# Patient Record
Sex: Female | Born: 1970
Health system: Southern US, Community
[De-identification: ages and names within clinical notes are randomized; demographics above are authoritative.]

## PROBLEM LIST (undated history)

## (undated) DIAGNOSIS — L719 Rosacea, unspecified: Secondary | ICD-10-CM

## (undated) DIAGNOSIS — O24419 Gestational diabetes mellitus in pregnancy, unspecified control: Secondary | ICD-10-CM

## (undated) DIAGNOSIS — L709 Acne, unspecified: Secondary | ICD-10-CM

## (undated) DIAGNOSIS — E786 Lipoprotein deficiency: Secondary | ICD-10-CM

## (undated) DIAGNOSIS — N3010 Interstitial cystitis (chronic) without hematuria: Secondary | ICD-10-CM

## (undated) DIAGNOSIS — B1910 Unspecified viral hepatitis B without hepatic coma: Secondary | ICD-10-CM

## (undated) DIAGNOSIS — I10 Essential (primary) hypertension: Secondary | ICD-10-CM

## (undated) HISTORY — DX: Rosacea, unspecified: L71.9

## (undated) HISTORY — DX: Acne, unspecified: L70.9

## (undated) HISTORY — PX: OTHER SURGICAL HISTORY: SHX169

## (undated) HISTORY — DX: Unspecified viral hepatitis B without hepatic coma: B19.10

## (undated) HISTORY — DX: Interstitial cystitis (chronic) without hematuria: N30.10

## (undated) HISTORY — DX: Gestational diabetes mellitus in pregnancy, unspecified control: O24.419

## (undated) HISTORY — DX: Lipoprotein deficiency: E78.6

## (undated) HISTORY — DX: Essential (primary) hypertension: I10

---

## 1997-11-23 ENCOUNTER — Inpatient Hospital Stay (HOSPITAL_COMMUNITY): Admission: AD | Admit: 1997-11-23 | Discharge: 1997-11-23 | Payer: Self-pay | Admitting: *Deleted

## 1997-12-19 ENCOUNTER — Encounter: Admission: RE | Admit: 1997-12-19 | Discharge: 1998-03-19 | Payer: Self-pay | Admitting: *Deleted

## 1997-12-26 ENCOUNTER — Inpatient Hospital Stay (HOSPITAL_COMMUNITY): Admission: AD | Admit: 1997-12-26 | Discharge: 1997-12-26 | Payer: Self-pay | Admitting: *Deleted

## 1998-01-17 ENCOUNTER — Inpatient Hospital Stay (HOSPITAL_COMMUNITY): Admission: AD | Admit: 1998-01-17 | Discharge: 1998-01-20 | Payer: Self-pay | Admitting: Obstetrics and Gynecology

## 1999-04-04 ENCOUNTER — Other Ambulatory Visit: Admission: RE | Admit: 1999-04-04 | Discharge: 1999-04-04 | Payer: Self-pay | Admitting: *Deleted

## 1999-05-19 ENCOUNTER — Emergency Department (HOSPITAL_COMMUNITY): Admission: EM | Admit: 1999-05-19 | Discharge: 1999-05-19 | Payer: Self-pay | Admitting: *Deleted

## 2000-04-26 ENCOUNTER — Other Ambulatory Visit: Admission: RE | Admit: 2000-04-26 | Discharge: 2000-04-26 | Payer: Self-pay | Admitting: *Deleted

## 2002-11-01 ENCOUNTER — Other Ambulatory Visit: Admission: RE | Admit: 2002-11-01 | Discharge: 2002-11-01 | Payer: Self-pay | Admitting: Obstetrics & Gynecology

## 2004-10-28 ENCOUNTER — Ambulatory Visit (HOSPITAL_BASED_OUTPATIENT_CLINIC_OR_DEPARTMENT_OTHER): Admission: RE | Admit: 2004-10-28 | Discharge: 2004-10-28 | Payer: Self-pay | Admitting: Urology

## 2004-10-28 ENCOUNTER — Ambulatory Visit (HOSPITAL_COMMUNITY): Admission: RE | Admit: 2004-10-28 | Discharge: 2004-10-28 | Payer: Self-pay | Admitting: Urology

## 2005-12-11 ENCOUNTER — Ambulatory Visit (HOSPITAL_BASED_OUTPATIENT_CLINIC_OR_DEPARTMENT_OTHER): Admission: RE | Admit: 2005-12-11 | Discharge: 2005-12-11 | Payer: Self-pay | Admitting: Urology

## 2006-05-28 ENCOUNTER — Ambulatory Visit (HOSPITAL_COMMUNITY): Admission: RE | Admit: 2006-05-28 | Discharge: 2006-05-28 | Payer: Self-pay

## 2007-05-13 ENCOUNTER — Ambulatory Visit: Payer: Self-pay | Admitting: Internal Medicine

## 2007-05-16 ENCOUNTER — Emergency Department (HOSPITAL_COMMUNITY): Admission: EM | Admit: 2007-05-16 | Discharge: 2007-05-16 | Payer: Self-pay | Admitting: Emergency Medicine

## 2007-05-16 ENCOUNTER — Telehealth: Payer: Self-pay | Admitting: *Deleted

## 2007-05-19 ENCOUNTER — Emergency Department (HOSPITAL_COMMUNITY): Admission: EM | Admit: 2007-05-19 | Discharge: 2007-05-19 | Payer: Self-pay | Admitting: Family Medicine

## 2007-05-20 ENCOUNTER — Ambulatory Visit: Payer: Self-pay | Admitting: Internal Medicine

## 2007-05-20 DIAGNOSIS — N61 Mastitis without abscess: Secondary | ICD-10-CM

## 2007-06-20 ENCOUNTER — Ambulatory Visit: Payer: Self-pay | Admitting: Internal Medicine

## 2007-07-19 ENCOUNTER — Ambulatory Visit: Payer: Self-pay | Admitting: Internal Medicine

## 2007-07-19 DIAGNOSIS — N3010 Interstitial cystitis (chronic) without hematuria: Secondary | ICD-10-CM

## 2007-07-19 DIAGNOSIS — N644 Mastodynia: Secondary | ICD-10-CM | POA: Insufficient documentation

## 2007-07-24 DIAGNOSIS — R946 Abnormal results of thyroid function studies: Secondary | ICD-10-CM | POA: Insufficient documentation

## 2007-07-27 ENCOUNTER — Telehealth: Payer: Self-pay | Admitting: *Deleted

## 2007-07-27 LAB — CONVERTED CEMR LAB
ALT: 30 units/L (ref 0–35)
AST: 26 units/L (ref 0–37)
Albumin: 3.9 g/dL (ref 3.5–5.2)
Alkaline Phosphatase: 36 units/L — ABNORMAL LOW (ref 39–117)
BUN: 10 mg/dL (ref 6–23)
Basophils Absolute: 0 10*3/uL (ref 0.0–0.1)
Basophils Relative: 0.8 % (ref 0.0–1.0)
Bilirubin, Direct: 0.1 mg/dL (ref 0.0–0.3)
CO2: 28 meq/L (ref 19–32)
Calcium: 9.5 mg/dL (ref 8.4–10.5)
Chloride: 106 meq/L (ref 96–112)
Cholesterol: 162 mg/dL (ref 0–200)
Creatinine, Ser: 0.7 mg/dL (ref 0.4–1.2)
Eosinophils Absolute: 0.1 10*3/uL (ref 0.0–0.7)
Eosinophils Relative: 1.6 % (ref 0.0–5.0)
GFR calc Af Amer: 121 mL/min
GFR calc non Af Amer: 100 mL/min
Glucose, Bld: 97 mg/dL (ref 70–99)
HCT: 37.7 % (ref 36.0–46.0)
HDL: 45 mg/dL (ref >39.0)
Hemoglobin: 12.9 g/dL (ref 12.0–15.0)
LDL Cholesterol: 103 mg/dL — ABNORMAL HIGH (ref 0–99)
Lymphocytes Relative: 38.1 % (ref 12.0–46.0)
MCHC: 34.3 g/dL (ref 30.0–36.0)
MCV: 90.3 fL (ref 78.0–100.0)
Monocytes Absolute: 0.5 10*3/uL (ref 0.1–1.0)
Monocytes Relative: 10.6 % (ref 3.0–12.0)
Neutro Abs: 2.6 10*3/uL (ref 1.4–7.7)
Neutrophils Relative %: 48.9 % (ref 43.0–77.0)
Platelets: 231 10*3/uL (ref 150–400)
Potassium: 4.1 meq/L (ref 3.5–5.1)
RBC: 4.17 M/uL (ref 3.87–5.11)
RDW: 13.2 % (ref 11.5–14.6)
Sodium: 142 meq/L (ref 135–145)
TSH: 0.27 microintl units/mL — ABNORMAL LOW (ref 0.35–5.50)
Total Bilirubin: 0.8 mg/dL (ref 0.3–1.2)
Total CHOL/HDL Ratio: 3.6
Total Protein: 7.3 g/dL (ref 6.0–8.3)
Triglycerides: 69 mg/dL (ref 0–149)
VLDL: 14 mg/dL (ref 0–40)
WBC: 5.1 10*3/uL (ref 4.5–10.5)

## 2007-07-29 ENCOUNTER — Ambulatory Visit: Payer: Self-pay | Admitting: Internal Medicine

## 2007-08-02 ENCOUNTER — Ambulatory Visit: Payer: Self-pay | Admitting: Internal Medicine

## 2007-08-15 ENCOUNTER — Encounter: Payer: Self-pay | Admitting: Internal Medicine

## 2007-08-19 ENCOUNTER — Encounter: Admission: RE | Admit: 2007-08-19 | Discharge: 2007-08-19 | Payer: Self-pay | Admitting: Surgery

## 2007-10-05 ENCOUNTER — Other Ambulatory Visit: Admission: RE | Admit: 2007-10-05 | Discharge: 2007-10-05 | Payer: Self-pay | Admitting: Internal Medicine

## 2007-10-05 ENCOUNTER — Encounter: Payer: Self-pay | Admitting: Internal Medicine

## 2007-10-05 ENCOUNTER — Ambulatory Visit: Payer: Self-pay | Admitting: Internal Medicine

## 2007-10-05 DIAGNOSIS — R300 Dysuria: Secondary | ICD-10-CM

## 2007-10-05 LAB — CONVERTED CEMR LAB
Bilirubin Urine: NEGATIVE
Blood in Urine, dipstick: NEGATIVE
Glucose, Urine, Semiquant: NEGATIVE
Ketones, urine, test strip: NEGATIVE
Nitrite: NEGATIVE
Protein, U semiquant: NEGATIVE
Specific Gravity, Urine: 1.005
Urobilinogen, UA: 0.2
pH: 8

## 2007-10-06 ENCOUNTER — Encounter: Payer: Self-pay | Admitting: Internal Medicine

## 2007-10-24 ENCOUNTER — Encounter: Payer: Self-pay | Admitting: Internal Medicine

## 2008-01-18 ENCOUNTER — Ambulatory Visit: Payer: Self-pay | Admitting: Internal Medicine

## 2008-01-18 DIAGNOSIS — T50995A Adverse effect of other drugs, medicaments and biological substances, initial encounter: Secondary | ICD-10-CM

## 2008-01-18 LAB — CONVERTED CEMR LAB
Bilirubin Urine: NEGATIVE
Glucose, Urine, Semiquant: NEGATIVE
Ketones, urine, test strip: NEGATIVE
Urobilinogen, UA: 1
pH: 6

## 2008-01-19 ENCOUNTER — Encounter: Payer: Self-pay | Admitting: Internal Medicine

## 2008-03-02 ENCOUNTER — Ambulatory Visit: Payer: Self-pay | Admitting: Family Medicine

## 2008-03-02 DIAGNOSIS — J019 Acute sinusitis, unspecified: Secondary | ICD-10-CM

## 2009-01-05 HISTORY — PX: ESSURE TUBAL LIGATION: SUR464

## 2009-01-24 ENCOUNTER — Ambulatory Visit: Payer: Self-pay | Admitting: Family Medicine

## 2009-01-24 DIAGNOSIS — M549 Dorsalgia, unspecified: Secondary | ICD-10-CM | POA: Insufficient documentation

## 2009-05-09 ENCOUNTER — Ambulatory Visit: Payer: Self-pay | Admitting: Internal Medicine

## 2009-05-09 DIAGNOSIS — L708 Other acne: Secondary | ICD-10-CM

## 2009-05-09 DIAGNOSIS — N390 Urinary tract infection, site not specified: Secondary | ICD-10-CM | POA: Insufficient documentation

## 2009-05-09 LAB — CONVERTED CEMR LAB
Bilirubin Urine: NEGATIVE
Glucose, Urine, Semiquant: NEGATIVE
Ketones, urine, test strip: NEGATIVE
Protein, U semiquant: NEGATIVE
Specific Gravity, Urine: 1.02
pH: 5.5

## 2009-07-19 ENCOUNTER — Ambulatory Visit: Payer: Self-pay | Admitting: Internal Medicine

## 2009-07-19 LAB — CONVERTED CEMR LAB
Albumin: 3.8 g/dL (ref 3.5–5.2)
Basophils Absolute: 0 10*3/uL (ref 0.0–0.1)
Basophils Relative: 0.6 % (ref 0.0–3.0)
CO2: 29 meq/L (ref 19–32)
Chloride: 111 meq/L (ref 96–112)
Creatinine, Ser: 0.6 mg/dL (ref 0.4–1.2)
Eosinophils Absolute: 0.1 10*3/uL (ref 0.0–0.7)
Glucose, Bld: 112 mg/dL — ABNORMAL HIGH (ref 70–99)
Glucose, Urine, Semiquant: NEGATIVE
HDL: 35 mg/dL — ABNORMAL LOW (ref >39.00)
Hemoglobin: 12.6 g/dL (ref 12.0–15.0)
Ketones, urine, test strip: NEGATIVE
Lymphs Abs: 2.7 10*3/uL (ref 0.7–4.0)
MCHC: 34.2 g/dL (ref 30.0–36.0)
MCV: 92.2 fL (ref 78.0–100.0)
Monocytes Absolute: 0.5 10*3/uL (ref 0.1–1.0)
Neutro Abs: 4.1 10*3/uL (ref 1.4–7.7)
Nitrite: NEGATIVE
RBC: 4.01 M/uL (ref 3.87–5.11)
RDW: 13.7 % (ref 11.5–14.6)
Sodium: 140 meq/L (ref 135–145)
Specific Gravity, Urine: >=1.03
TSH: 0.41 microintl units/mL (ref 0.35–5.50)
Total CHOL/HDL Ratio: 3
Total Protein: 6.8 g/dL (ref 6.0–8.3)
Triglycerides: 98 mg/dL (ref 0.0–149.0)
pH: 6

## 2009-07-24 ENCOUNTER — Ambulatory Visit: Payer: Self-pay | Admitting: Internal Medicine

## 2009-07-24 ENCOUNTER — Other Ambulatory Visit: Admission: RE | Admit: 2009-07-24 | Discharge: 2009-07-24 | Payer: Self-pay | Admitting: Internal Medicine

## 2009-07-24 DIAGNOSIS — E786 Lipoprotein deficiency: Secondary | ICD-10-CM | POA: Insufficient documentation

## 2009-07-24 DIAGNOSIS — R7309 Other abnormal glucose: Secondary | ICD-10-CM

## 2009-07-29 LAB — CONVERTED CEMR LAB: Pap Smear: NEGATIVE

## 2009-10-25 ENCOUNTER — Ambulatory Visit: Payer: Self-pay | Admitting: Internal Medicine

## 2009-10-25 LAB — CONVERTED CEMR LAB
Glucose, Urine, Semiquant: NEGATIVE
Protein, U semiquant: NEGATIVE
Specific Gravity, Urine: 1.025
pH: 6

## 2009-10-29 ENCOUNTER — Telehealth: Payer: Self-pay | Admitting: Internal Medicine

## 2009-11-15 ENCOUNTER — Ambulatory Visit: Payer: Self-pay | Admitting: Internal Medicine

## 2009-11-16 ENCOUNTER — Encounter: Payer: Self-pay | Admitting: Internal Medicine

## 2009-12-06 ENCOUNTER — Ambulatory Visit: Payer: Self-pay | Admitting: Internal Medicine

## 2009-12-06 DIAGNOSIS — J042 Acute laryngotracheitis: Secondary | ICD-10-CM | POA: Insufficient documentation

## 2010-01-28 ENCOUNTER — Ambulatory Visit (HOSPITAL_COMMUNITY)
Admission: RE | Admit: 2010-01-28 | Discharge: 2010-01-28 | Payer: Self-pay | Source: Home / Self Care | Attending: Obstetrics and Gynecology | Admitting: Obstetrics and Gynecology

## 2010-01-29 LAB — PREGNANCY, URINE: Preg Test, Ur: NEGATIVE

## 2010-02-02 LAB — CONVERTED CEMR LAB: TSH: 0.5 microintl units/mL (ref 0.35–5.50)
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
## 2010-02-06 NOTE — Assessment & Plan Note (Signed)
Summary: severe cough//nn   Vital Signs:  Patient profile:   40 year old female Menstrual status:  regular Weight:      134 pounds O2 Sat:      98 % on Room air Temp:     98.3 degrees F oral BP sitting:   102 / 78  (left arm) Cuff size:   regular  Vitals Entered By: Kern Reap CMA Duncan Dull) (December 06, 2009 2:10 PM)  O2 Flow:  Room air CC: cough and congestion   History of Present Illness: Janet Nichols  comes in today  for SDA  for above  to be seen before weekend as  .   she was seen at urgent care [facility]     was given cheritussin and still coughing .     Hurts when coughs   . no fever and some cold chills .  NOt seeming to get better .    Out  of cough med.   Stuffy nose   a bit . Denies face pain   onset around Nov 24   and continue.  child may be gettig sick also  Preventive Screening-Counseling & Management  Alcohol-Tobacco     Alcohol drinks/day: 0     Smoking Status: never  Allergies: 1)  ! Septra  Past History:  Past medical, surgical, family and social histories (including risk factors) reviewed, and no changes noted (except as noted below).  Past Medical History: Reviewed history from 07/24/2009 and no changes required. Unremarkable Dr Logan Bores  for bladder  issues    ? IC childbirth x 2     [name] is [name]  urologist    Dr. Orville Govern  gestational diabetes x 1  low hdl acne /rosacea  Past Surgical History: Reviewed history from 10/25/2009 and no changes required. Bladder stretched years ago E-sure procedure  Family History: Reviewed history from 07/24/2009 and no changes required. cousin  breast cancer 48 yrs   MOM DM HBP Sis has HBP        Social History: Reviewed history from 07/24/2009 and no changes required. Married Never Smoked   HH of  4    no pets sleep 7 hours    40 hours at least    exercise  sometimes      Review of Systems       The patient complains of anorexia, hoarseness, and chest pain.  The patient denies  fever, decreased hearing, dyspnea on exertion, peripheral edema, prolonged cough, hemoptysis, abdominal pain, melena, hematochezia, difficulty walking, abnormal bleeding, enlarged lymph nodes, and angioedema.    Physical Exam  General:  non toxic  hoarse in nad wiwth dry cough   Head:  Normocephalic and atraumatic without obvious abnormalities. No apparent alopecia or balding. Eyes:  clear  Ears:  R ear normal.  wax in left  Nose:  no external deformity and no external erythema.  clear congestion and face nontender Mouth:  pharynx pink and moist.   Neck:  tender  shoddy ac nodes  Lungs:  Normal respiratory effort, chest expands symmetrically. Lungs are clear to auscultation, no crackles or wheezes.no dullness.   Heart:  Normal rate and regular rhythm. S1 and S2 normal without gallop, murmur, click, rub or other extra sounds.no lifts.   Pulses:  nl cap refill  Extremities:  no clubbing cyanosis or edema  Skin:  turgor normal, color normal, no ecchymoses, and no petechiae.   Cervical Nodes:  No lymphadenopathy noted see neck exam  Psych:  Oriented X3, good eye contact, not anxious appearing, and not depressed appearing.     Impression & Recommendations:  Problem # 1:  ACUTE LARYNGOTRACHEITIS W/O MENTION OBSTRUCTION (ICD-464.20) still seems viral    and no alarm features  .   Expectant management and continue supportive care .       elevated risk or pneumonia  signs   Complete Medication List: 1)  Doxycycline Hyclate 100 Mg Caps (Doxycycline hyclate) .Marland Kitchen.. 1 by mouth a day for acne 2)  Adapalene 0.1 % Crea (Adapalene) .... Apply to face  q hs  pea siced amount 3)  Hydrocodone-homatropine 5-1.5 Mg/86ml Syrp (Hydrocodone-homatropine) .Marland Kitchen.. 1-2 tsp by mouth q4-6 hours as needed cough  Patient Instructions: 1)  I think this is a viral  respiratory infection  like croup in a child. 2)  no signs of pneumonia today. You pulse ox is 98-99% 3)  cough med for comfort  4)  try adding plain Mucinex   to loosen and make the cough easier.  5)  expect improvement in the next 5-7 days   6)  call if fever  shortness of breath or wheezing .  Prescriptions: HYDROCODONE-HOMATROPINE 5-1.5 MG/5ML SYRP (HYDROCODONE-HOMATROPINE) 1-2 tsp by mouth q4-6 hours as needed cough  #6 oz x 1   Entered and Authorized by:   Madelin Headings MD   Signed by:   Madelin Headings MD on 12/06/2009   Method used:   Print then Give to Patient   RxID:   [id number]    Orders Added: 1)  Est. Patient Level III [14782]

## 2010-02-06 NOTE — Progress Notes (Signed)
----   Converted from flag ---- ---- 10/28/2009 3:06 PM, Wynona Canes, CMA wrote: Sorry, culture was not ordered.  ---- 10/26/2009 11:36 AM, Madelin Headings MD wrote: i asked for a culture but dont see that it was ordered .   ?  was  it sent? ------------------------------  Phone Note Outgoing Call      please call  patient  I was informed that the the urine  culture was not sent . Asssess  how she is doing  . if still has any .signs then do a culture  .  wkp.  LMTOCB Romualdo Bolk, CMA (AAMA)  October 30, 2009 12:07 PM  Spoke to pt and she states that she is having on and off burning. I told pt that she needs to come in to get a urine culture done. Pt states that she wants to wait until Thurs to get it done. She states that she can wait until then. Romualdo Bolk, CMA Duncan Dull)  November 01, 2009 1:26 PM noted Madelin Headings MD  November 01, 2009 1:56 PM

## 2010-02-06 NOTE — Assessment & Plan Note (Signed)
Summary: cpx--pap//ccm   Vital Signs:  Patient profile:   40 year old female Menstrual status:  regular LMP:     07/07/2009 Height:      61.5 inches Weight:      138 pounds Pulse rate:   78 / minute BP sitting:   120 / 80  (left arm) Cuff size:   regular  Vitals Entered By: Romualdo Bolk, CMA (AAMA) (July 24, 2009 2:19 PM) CC: CPX with pap LMP (date): 07/07/2009     Enter LMP: 07/07/2009 Last PAP Result NEGATIVE FOR INTRAEPITHELIAL LESIONS OR MALIGNANCY.   History of Present Illness: Janet Nichols comes in today  for preventive visit  follow up of  acne skin issues . Since last visit  here  there have been no major changes in health status   Her skin  has improved and no se of  meds noted.  Sees   urologist about once a year for her IC  no current problems.   Preventive Care Screening  Mammogram:    Date:  07/29/2007    Results:  normal   Prior Values:    Pap Smear:  NEGATIVE FOR INTRAEPITHELIAL LESIONS OR MALIGNANCY. (10/05/2007)    Last Tetanus Booster:  Tdap (07/19/2007)   Preventive Screening-Counseling & Management  Alcohol-Tobacco     Alcohol drinks/day: 0     Smoking Status: never  Caffeine-Diet-Exercise     Caffeine use/day: 0     Does Patient Exercise: yes     Type of exercise: treadmill   Current Medications (verified): 1)  Doxycycline Hyclate 100 Mg Caps (Doxycycline Hyclate) .Marland Kitchen.. 1 By Mouth Two Times A Day For Acne 2)  Adapalene 0.1 % Crea (Adapalene) .... Apply To Face  Q Hs  Pea Siced Amount  Allergies (verified): 1)  ! Septra  Past History:  Past medical, surgical, family and social histories (including risk factors) reviewed, and no changes noted (except as noted below).  Past Medical History: Unremarkable Dr Logan Bores  for bladder  issues    ? IC childbirth x 2     Janet Nichols is Janet Nichols  urologist    Dr. Orville Govern  gestational diabetes x 1  low hdl acne /rosacea  Past Surgical History: Reviewed history from 07/19/2007  and no changes required. Bladder stretched years ago  Past History:  Care Management: Urology: [facility]  Family History: Reviewed history from 01/18/2008 and no changes required. cousin  breast cancer 48 yrs   MOM DM HBP Sis has HBP        Social History: Reviewed history from 01/18/2008 and no changes required. Married Never Smoked   HH of  4    no pets sleep 7 hours    40 hours at least    exercise  sometimes      Review of Systems  The patient denies anorexia, fever, weight loss, weight gain, vision loss, decreased hearing, hoarseness, chest pain, syncope, dyspnea on exertion, peripheral edema, prolonged cough, headaches, hemoptysis, abdominal pain, melena, hematochezia, severe indigestion/heartburn, hematuria, incontinence, genital sores, muscle weakness, suspicious skin lesions, transient blindness, difficulty walking, depression, unusual weight change, abnormal bleeding, enlarged lymph nodes, angioedema, and breast masses.    Physical Exam  General:  Well-developed,well-nourished,in no acute distress; alert,appropriate and cooperative throughout examination Head:  normocephalic and atraumatic.   Eyes:  PERRL, EOMs full, conjunctiva clear  Ears:  R ear normal, L ear normal, and no external deformities.   Nose:  no external deformity, no external erythema, and no nasal  discharge.   Mouth:  pharynx pink and moist.   Neck:  No deformities, masses, or tenderness noted. / thyroid palpable Chest Wall:  No deformities, masses, or tenderness noted. Breasts:  No mass, nodules, thickening, tenderness, bulging, retraction, inflamation, nipple discharge or skin changes noted.   Lungs:  Normal respiratory effort, chest expands symmetrically. Lungs are clear to auscultation, no crackles or wheezes. Heart:  Normal rate and regular rhythm. S1 and S2 normal without gallop, murmur, click, rub or other extra sounds. Abdomen:  Bowel sounds positive,abdomen soft and non-tender  without masses, organomegaly or hernias noted. Rectal:  No external abnormalities noted. Normal sphincter tone. No rectal masses or tenderness. Genitalia:  Pelvic Exam:        External: normal female genitalia without lesions or masses        Vagina: normal without lesions or masses white discharge         Cervix: normal without lesions or masses flat extopy sliglty friable   white dc         Adnexa: normal bimanual exam without masses or fullness        Uterus: normal by palpation        Pap smear: performed Msk:  no joint swelling, no joint warmth, no redness over joints, and no joint deformities.   Pulses:  R and L carotid,radial,femoral,dorsalis pedis and posterior tibial pulses are full and equal bilaterally Extremities:  no clubbing cyanosis or edema  Neurologic:  alert & oriented X3 and cranial nerves IIi-XII intact.   strength normal in all extremities, and gait normal.   Skin:  turgor normal and color normal.   face with some erythema ocass pustule chin and few papules mid face   Cervical Nodes:  No lymphadenopathy noted Psych:  Oriented X3, normally interactive, good eye contact, not anxious appearing, and not depressed appearing.     Impression & Recommendations:  Problem # 1:  Preventive Health Care (ICD-V70.0) Discussed nutrition,exercise,diet,healthy weight, vitamin D and calcium.   Problem # 2:  ROUTINE GYNECOLOGICAL EXAM (ICD-V72.31)  pap done   rx for yeast if gets signs   Orders: Pap Smear, Thin Prep ( Collection of) ([id number])  Problem # 3:  ACNE VULGARIS, FACIAL (ICD-706.1) poss   rosacea ?   Her updated medication list for this problem includes:    Doxycycline Hyclate 100 Mg Caps (Doxycycline hyclate) .Marland Kitchen... 1 by mouth two times a day for acne    Adapalene 0.1 % Crea (Adapalene) .Marland Kitchen... Apply to face  q hs  pea siced amount  Problem # 4:  HYPERGLYCEMIA (ICD-790.29) with fam hs and personal hx of gestational dm high risk        counseled   Problem # 5:  LOW HDL  (ICD-272.5) Assessment: Comment Only see above   Problem # 6:  INTERSTITIAL CYSTITIS (ICD-595.1) Assessment: Comment Only  Complete Medication List: 1)  Doxycycline Hyclate 100 Mg Caps (Doxycycline hyclate) .Marland Kitchen.. 1 by mouth two times a day for acne 2)  Adapalene 0.1 % Crea (Adapalene) .... Apply to face  q hs  pea siced amount  Patient Instructions: 1)  after a month  decrease   the  doxyxycline to once a day  2)  ROV in 3 months or as needed. 3)  Consider Add hormonal therapy   if needed then . 4)  Your good lipids are low and her blood sugar is  somehwat elevated .  You are at risk of developing diabetes so avoid sweets and simple  sugars.      5)  Increase exercise.

## 2010-02-06 NOTE — Assessment & Plan Note (Signed)
Summary: ?uti//ccm   Vital Signs:  Patient profile:   40 year old female Menstrual status:  regular LMP:     04/25/2009 Height:      62 inches Weight:      134 pounds BMI:     24.60 Temp:     98.2 degrees F oral Pulse rate:   78 / minute BP sitting:   100 / 60  (left arm) Cuff size:   regular  Vitals Entered By: Romualdo Bolk, CMA (AAMA) (May 09, 2009 10:16 AM) CC: Urinary frequency, burning upon urination, cloudy urine. This started on 05/07/09, lower back pain LMP (date): 04/25/2009     Menstrual Status regular Enter LMP: 04/25/2009 Last PAP Result NEGATIVE FOR INTRAEPITHELIAL LESIONS OR MALIGNANCY.   History of Present Illness: Janet Nichols comes in   for  above .  She has had 2 days of acut onset burning and frequency and lbp without fever . this feels like utii she has had over a year ago.,  Urine is  cloudy   .  ALso face  keeps breaking out    worse recent months  with periods htat are otherwise nl.  Doesn remember this bad when younger.  No rx now .   saw derm years ago and doesnt remember what  she was rx with.    Periods are normal  by report.    Preventive Screening-Counseling & Management  Alcohol-Tobacco     Alcohol drinks/day: 0     Smoking Status: never  Caffeine-Diet-Exercise     Caffeine use/day: 0     Does Patient Exercise: yes  Current Medications (verified): 1)  None  Allergies (verified): 1)  ! Septra  Past History:  Past medical, surgical, family and social histories (including risk factors) reviewed, and no changes noted (except as noted below).  Past Medical History: Reviewed history from 01/18/2008 and no changes required. Unremarkable Dr Logan Bores  for bladder  issues    ? IC childbirth x 2     Janet Nichols is Janet Nichols  urologist    Dr. Orville Govern   Past Surgical History: Reviewed history from 07/19/2007 and no changes required. Bladder stretched years ago  Past History:  Care Management: Urology: [facility]  Family History: Reviewed history from 01/18/2008 and no changes required. cousin  breast cancer 48 yrs   MOM DM HBP Sis has HBP       Social History: Reviewed history from 01/18/2008 and no changes required. Married Never Smoked   HH of  5  no pets sleep 7 hours    40 hours at least   exercise  sometimes    Caffeine use/day:  0 Does Patient Exercise:  yes  Review of Systems  The patient denies anorexia, fever, weight loss, weight gain, abdominal pain, melena, hematochezia, severe indigestion/heartburn, hematuria, incontinence, genital sores, abnormal bleeding, and enlarged lymph nodes.         periods regular  Physical Exam  General:  Well-developed,well-nourished,in no acute distress; alert,appropriate and cooperative throughout examination Head:  Normocephalic and atraumatic without obvious abnormalities. No apparent alopecia or balding. Eyes:  clear  Neck:  No deformities, masses, or tenderness noted. Lungs:  normal respiratory effort, no intercostal retractions, and no accessory muscle use.   Heart:  Normal rate and regular rhythm. S1 and S2 normal without gallop, murmur, click, rub or other extra sounds. Abdomen:  Bowel sounds positive,abdomen soft and non-tender without masses, organomegaly or hernias noted. no flank pain   poiints  to low back as area of concern Pulses:  nl cap refill  Skin:  red and cystic areas miud face left more than right with pustules also  some erythema and find papules   No balding or hair loss  Cervical Nodes:  No lymphadenopathy noted Psych:  Oriented X3, good eye contact, not anxious appearing, and not depressed appearing.     Impression & Recommendations:  Problem # 1:  UTI (ICD-599.0) Assessment New probably  Her updated medication list for this problem includes:    Nitrofurantoin Monohyd Macro 100 Mg Caps (Nitrofurantoin monohyd macro) .Marland Kitchen... 1 by mouth two times a day for  uti    Doxycycline Hyclate 100 Mg Caps (Doxycycline  hyclate) .Marland Kitchen... 1 by mouth two times a day for acne  Problem # 2:  ACNE VULGARIS, FACIAL (ICD-706.1) Assessment: New pretty sever and some cystic changes    some rosacea possible.     begin meds   may benefit from  ocps and referral if needed   will begin therapy and then follow up at [facility]  if not responding then other intervention  .  Her updated medication list for this problem includes:    Doxycycline Hyclate 100 Mg Caps (Doxycycline hyclate) .Marland Kitchen... 1 by mouth two times a day for acne    Adapalene 0.1 % Crea (Adapalene) .Marland Kitchen... Apply to face  q hs  pea siced amount  Problem # 3:  INTERSTITIAL CYSTITIS (ICD-595.1) Assessment: Comment Only  Complete Medication List: 1)  Nitrofurantoin Monohyd Macro 100 Mg Caps (Nitrofurantoin monohyd macro) .Marland Kitchen.. 1 by mouth two times a day for  uti 2)  Doxycycline Hyclate 100 Mg Caps (Doxycycline hyclate) .Marland Kitchen.. 1 by mouth two times a day for acne 3)  Adapalene 0.1 % Crea (Adapalene) .... Apply to face  q hs  pea siced amount  Other Orders: UA Dipstick w/o Micro (automated)  (81003)  Patient Instructions: 1)  take antibiotic for your UTI. 2)  After that begin  doxyxycline for the acne . 3)  begin the differin at night  small amount and wash off in am.  This can make your face a bit iriitated but  can  use every other night then  4)  should get better in 3-4 weeks . 5)  Keep July appt  and call in  meantime if have problems.  6)  Consider  adding hormonal therapy if not getting better ( BCPs)  Prescriptions: ADAPALENE 0.1 % CREA (ADAPALENE) apply to face  q hs  pea siced amount  #1 tube x 1   Entered and Authorized by:   Madelin Headings MD   Signed by:   Madelin Headings MD on 05/09/2009   Method used:   Electronically to        [facility]  [phone]* (retail)       [address]       [place], [place]  [address]       Ph: [phone] or [phone]       Fax: [phone]   RxID:   [id number] DOXYCYCLINE  HYCLATE 100 MG CAPS (DOXYCYCLINE HYCLATE) 1 by mouth two times a day for acne  #60 x 1   Entered and Authorized by:   Madelin Headings MD   Signed by:   Madelin Headings MD on 05/09/2009   Method used:   Electronically to        [facility]  #  [address] (retail)       [address]       [place], [place]  [address]       Ph: [phone] or [phone]       Fax: [phone]   RxID:   [id number] NITROFURANTOIN MONOHYD MACRO 100 MG CAPS (NITROFURANTOIN MONOHYD MACRO) 1 by mouth two times a day for  UTI  #14 x 0   Entered and Authorized by:   Madelin Headings MD   Signed by:   Madelin Headings MD on 05/09/2009   Method used:   Electronically to        [facility]  [phone]* (retail)       [address]       [place], [place]  [address]       Ph: [phone] or [phone]       Fax: [phone]   RxID:   [id number]   Laboratory Results   Urine Tests  Date/Time Received: May 09, 2009 10:31 AM  Routine Urinalysis   Color: yellow Appearance: Cloudy Glucose: negative   (Normal Range: Negative) Bilirubin: negative   (Normal Range: Negative) Ketone: negative   (Normal Range: Negative) Spec. Gravity: 1.020   (Normal Range: 1.003-1.035) Blood: small   (Normal Range: Negative) pH: 5.5   (Normal Range: 5.0-8.0) Protein: negative   (Normal Range: Negative) Urobilinogen: 0.2   (Normal Range: 0-1) Nitrite: negative   (Normal Range: Negative) Leukocyte Esterace: small   (Normal Range: Negative)

## 2010-02-06 NOTE — Progress Notes (Signed)
----   Converted from flag ---- ---- 10/28/2009 3:06 PM, Wynona Canes, CMA wrote: Sorry, culture was not ordered.  ---- 10/26/2009 11:36 AM, Madelin Headings MD wrote: i asked for a culture but dont see that it was ordered .   ?  was  it sent? ------------------------------

## 2010-02-06 NOTE — Assessment & Plan Note (Signed)
Summary: 3 month rov/njr   Vital Signs:  Patient profile:   40 year old female Menstrual status:  regular LMP:     10/07/2009 Weight:      137 pounds Pulse rate:   60 / minute BP sitting:   110 / 60  (right arm) Cuff size:   regular  Vitals Entered By: Romualdo Bolk, CMA (AAMA) (October 25, 2009 1:39 PM) CC: Follow-up visit and pt is having cloudy urine with burning upon urination. LMP (date): 10/07/2009     Enter LMP: 10/07/2009 Last PAP Result NEGATIVE FOR INTRAEPITHELIAL LESIONS OR MALIGNANCY.   History of Present Illness: Janet Nichols comes in today  for  follow up of her skin problem. she has been taking doxycycline one today enter topical. She feels improved however she stops the doxycycline it flares up again. Just had gyneuri proedure and went off med for a short time and flared up. So back on med . no side effect of medicine.  Also has  slight dysuria no frequency. for the last few days no fever back pain but wants to make sure she doesn't have a bladder infection.  Preventive Screening-Counseling & Management  Alcohol-Tobacco     Alcohol drinks/day: 0     Smoking Status: never  Caffeine-Diet-Exercise     Caffeine use/day: 0     Does Patient Exercise: yes     Type of exercise: treadmill   Current Medications (verified): 1)  Doxycycline Hyclate 100 Mg Caps (Doxycycline Hyclate) .Marland Kitchen.. 1 By Mouth Two Times A Day For Acne 2)  Adapalene 0.1 % Crea (Adapalene) .... Apply To Face  Q Hs  Pea Siced Amount  Allergies (verified): 1)  ! Septra  Past History:  Past medical, surgical, family and social histories (including risk factors) reviewed, and no changes noted (except as noted below).  Past Medical History: Reviewed history from 07/24/2009 and no changes required. Unremarkable Dr Logan Bores  for bladder  issues    ? IC childbirth x 2     [name] is [name]  urologist    Dr. Orville Govern  gestational diabetes x 1  low hdl acne /rosacea  Past Surgical  History: Bladder stretched years ago E-sure procedure  Family History: Reviewed history from 07/24/2009 and no changes required. cousin  breast cancer 48 yrs   MOM DM HBP Sis has HBP        Social History: Reviewed history from 07/24/2009 and no changes required. Married Never Smoked   HH of  4    no pets sleep 7 hours    40 hours at least    exercise  sometimes      Review of Systems  The patient denies anorexia, fever, weight loss, weight gain, vision loss, decreased hearing, abdominal pain, melena, hematochezia, hematuria, incontinence, muscle weakness, abnormal bleeding, enlarged lymph nodes, and angioedema.    Physical Exam  General:  Well-developed,well-nourished,in no acute distress; alert,appropriate and cooperative throughout examination Head:  normocephalic and atraumatic.   Eyes:  vision grossly intact.   Neck:  No deformities, masses, or tenderness noted. Lungs:  normal respiratory effort and no intercostal retractions.   Abdomen:  Bowel sounds positive,abdomen soft and non-tender without masses, organomegaly or  s noted.  no flank pain Neurologic:  non focal  Skin:  face  with mask erythema and a few paulaes   temple area with some  acne lesions.   Cervical Nodes:  No lymphadenopathy noted Psych:  Normal eye contact, appropriate affect. Cognition appears normal.  Impression & Recommendations:  Problem # 1:  ACNE VULGARIS, FACIAL (ICD-706.1) Assessment Improved some of this looks like rosacea also doxycycline would be optimal. It is okay to change to once a day at present consider other top vocals if needed. Her updated medication list for this problem includes:    Doxycycline Hyclate 100 Mg Caps (Doxycycline hyclate) .Marland Kitchen... 1 by mouth a day for acne    Adapalene 0.1 % Crea (Adapalene) .Marland Kitchen... Apply to face  q hs  pea siced amount    Ciprofloxacin Hcl 500 Mg Tabs (Ciprofloxacin hcl) .Marland Kitchen... 1 by mouth two times a day for uti  Problem # 2:  DYSURIA  (ICD-788.1) slightly abnormal you able to culture prescription given in case things get worse otherwise no treatment until culture back. Her updated medication list for this problem includes:    Doxycycline Hyclate 100 Mg Caps (Doxycycline hyclate) .Marland Kitchen... 1 by mouth a day for acne    Ciprofloxacin Hcl 500 Mg Tabs (Ciprofloxacin hcl) .Marland Kitchen... 1 by mouth two times a day for uti  Orders: UA Dipstick w/o Micro (automated)  (81003)  Complete Medication List: 1)  Doxycycline Hyclate 100 Mg Caps (Doxycycline hyclate) .Marland Kitchen.. 1 by mouth a day for acne 2)  Adapalene 0.1 % Crea (Adapalene) .... Apply to face  q hs  pea siced amount 3)  Ciprofloxacin Hcl 500 Mg Tabs (Ciprofloxacin hcl) .Marland Kitchen.. 1 by mouth two times a day for uti  Patient Instructions: 1)  ok to continue on doxycycline daily and  topical  2)  have pharmacy contact us for refills. 3)  return office visit  at next check up or if worse in between.  4)  Will let you know about culture results  Prescriptions: CIPROFLOXACIN HCL 500 MG TABS (CIPROFLOXACIN HCL) 1 by mouth two times a day for UTI  #6 x 0   Entered and Authorized by:   Madelin Headings MD   Signed by:   Madelin Headings MD on 10/25/2009   Method used:   Print then Give to Patient   RxID:   [id number]    Orders Added: 1)  UA Dipstick w/o Micro (automated)  [81003] 2)  Est. Patient Level IV [60630]    Laboratory Results   Urine Tests    Routine Urinalysis   Color: yellow Appearance: Clear Glucose: negative   (Normal Range: Negative) Bilirubin: negative   (Normal Range: Negative) Ketone: negative   (Normal Range: Negative) Spec. Gravity: 1.025   (Normal Range: 1.003-1.035) Blood: trace-lysed   (Normal Range: Negative) pH: 6.0   (Normal Range: 5.0-8.0) Protein: negative   (Normal Range: Negative) Urobilinogen: 0.2   (Normal Range: 0-1) Nitrite: negative   (Normal Range: Negative) Leukocyte Esterace: trace   (Normal Range: Negative)    Comments: Rita Ohara   October 25, 2009 2:12 PM

## 2010-05-20 NOTE — Op Note (Signed)
NAMEARDETH, REPETTO NO.:  [id number]   MEDICAL RECORD NO.:  [id number]          PATIENT TYPE:  AMB   LOCATION:  DAY                          FACILITY:  [facility]   PHYSICIAN:  Jamison Neighbor, M.D.  DATE OF BIRTH:  [birth year]   DATE OF PROCEDURE:  05/28/2006  DATE OF DISCHARGE:                               OPERATIVE REPORT   PREOPERATIVE DIAGNOSES:  1. Interstitial cystitis.  2. Urgency incontinence.   POSTOPERATIVE DIAGNOSES:  1. Interstitial cystitis.  2. Urgency incontinence.   PROCEDURES:  1. Cystoscopy.  2. Urethral calibration.  3. Hydrodistention of the bladder.  4. Marcaine and Pyridium instillation.  5. Marcaine and Kenalog injection.  6. Botox injection (200 units).   SURGEON:  Jamison Neighbor, M.D.   ANESTHESIA:  General.   COMPLICATIONS:  None.   DRAINS:  None.   BRIEF HISTORY:  This 40 year old female is known to have interstitial  cystitis.  The patient responded at least in part to an instillation  protocol along with the use of medical therapy.  The patient is  currently taking Elmiron.  She also has urgency and urgency  incontinence, for which Vesicare has been partially helpful.  She is  interested in looking at alternative forms of therapy.  The patient is  felt to be an ideal candidate for percutaneous tibial nerve stimulation  but insurance would not cover this.  The patient did not wish to do a  truly invasive procedure like InterStim and for that reason has elected  to undergo a trial of Botox.  The patient understands that she might  have retention.  She understands that there will be postoperative pain  because of her IC, and she understands there is no guarantee that she  will have any relief of pain or frequency or urgency from this  procedure.  She gave full informed consent.   PROCEDURE:  After successful induction of general anesthesia, the  patient was placed in the dorsal lithotomy position, prepped with  Betadine  and draped in the usual sterile fashion.  Careful bimanual  examination revealed an unremarkable bladder with no significant  cystocele, rectocele or enterocele.  There were no masses on bimanual  exam.  Urethra was of normal size, accepting a 32-French female urethral  sound with no evidence of stenosis or stricture.  There was no sign of  diverticulum or mass.  The cystoscope was inserted.  The bladder was  carefully inspected.  It was free of any tumor or stones.  Both the  orifices were normal in configuration and location.  Hydrodistention of  the bladder was performed.  The bladder was filled at 100 cm pressure  for 5 minutes.  When the bladder was drained, glomerulations could be  seen throughout the bladder.  The patient had no ulcer formation and  bladder capacity was 600 mL.  The findings were certainly consistent  with interstitial cystitis.  The bladder was drained.  Botox was then  injected, a total of 20 injections delivering 200 units.  The bladder  was drained once again.  A mixture of Marcaine and Pyridium was left in  the bladder, Marcaine and Kenalog were injected periurethrally.  The  patient tolerated the procedure well and was taken to the recovery room  in good condition.  She will be sent home with Pyridium Plus as well as  doxycycline.  She already has Lorcet Plus at home.  She will continue on  Elmiron and Vesicare and we will consider other medications depending on  her response to therapy.           ______________________________  Jamison Neighbor, M.D.  Electronically Signed     RJE/MEDQ  D:  05/28/2006  T:  05/28/2006  Job:  [id number]

## 2010-05-23 NOTE — Op Note (Signed)
NAMESHAREECE, BULTMAN NO.:  [id number]   MEDICAL RECORD NO.:  [id number]          PATIENT TYPE:  AMB   LOCATION:  [facility]                         FACILITY:  [facility]   PHYSICIAN:  Jamison Neighbor, M.D.  DATE OF BIRTH:  18-Feb-1970   DATE OF PROCEDURE:  10/28/2004  DATE OF DISCHARGE:                                 OPERATIVE REPORT   PREOPERATIVE DIAGNOSIS:  Interstitial cystitis.   POSTOPERATIVE DIAGNOSIS:  Interstitial cystitis.   PROCEDURE:  Cystoscopy, urethral calibration, hydrodistention of the  bladder, Marcaine and Pyridium instillation, Marcaine and Kenalog injection.   SURGEON:  Jamison Neighbor, M.D.   ANESTHESIA:  General.   COMPLICATIONS:  None.   DRAINS:  None.   BRIEF HISTORY:  This 40 year old female was previously a patient of Dr.  Aliene Altes, but he felt that he had gone as far as he could in terms of fitting  what was presumed to be interstitial cystitis.  The patient had instillation  therapy with not much improvement.  She has used Elmiron and hydroxyzine, to  which we have added Elavil.  The patient has not responded not all that well  to this combination, although she has had some improvement.  She is still  voiding as often as every 20 minutes.  Dr. Aliene Altes had tried anticholinergic  therapy with no improvement.  She is now to undergo diagnostic cystoscopy  and hydrodistention to confirm the diagnosis and hopefully decrease her  symptoms.  She understands there is no guarantee she will have a decrease in  her urgency or frequency and certainly in the early postoperative period,  she will have an increasing amount of pain.  She gave full informed consent  for the procedure.   PROCEDURE:  After the successful induction of general anesthesia, the  patient is placed in a dorsal lithotomy position, prepped with Betadine, and  draped in the usual sterile fashion.  Careful bimanual examination revealed  an unremarkable urethra with no signs of  diverticula.  There was no drainage  of any kind.  The urethra was in normal position.  There was no cystocele,  rectocele, or enterocele.  There was no mass on bimanual exam.  The uterus  was palpably normal.  The urethra was dilated to a 50 [place] with female  urethral sounds but no signs of stenosis or stricture.  The cystoscope was  inserted.  The bladder was carefully inspected.  It was free of any tumor or  stones.  Both ureteral orifices were normal in configuration and location.  The urine coming from each was clear.  The bladder was distended at a  pressure of 100 cm of water for five minutes.  When the bladder was drained,  glomerulations could be seen throughout the bladder.  The patient had marked  ulcerations throughout the bladder, and diminished bladder capacity was only  500 cc.  This compares quite unfavorably with a normal bladder capacity of  1150 and is certainly less than the average for interstitial cystitis of  575.  The bladder was drained.  A mixture  of Marcaine and Pyridium was left  in the bladder.  Marcaine and Kenalog were injected periurethrally.  The  patient tolerated the procedure well and was taken to the recovery room in  good condition.  She will be sent home with  Lorcet Plus, Pyridium Plus, and doxycycline.  She was given intraoperative  Toradol, Zofran and a B&O suppository.  She will return to see me in  followup in three weeks time, at which point we will discuss her other  options if she is not improved.  These might include neuromodulation  therapy.           ______________________________  Jamison Neighbor, M.D.  Electronically Signed     RJE/MEDQ  D:  10/28/2004  T:  10/28/2004  Job:  [id number]

## 2010-05-23 NOTE — Op Note (Signed)
Janet Nichols, LEMMA NO.:  [id number]   MEDICAL RECORD NO.:  [id number]          PATIENT TYPE:  AMB   LOCATION:  [facility]                         FACILITY:  [facility]   PHYSICIAN:  Jamison Neighbor, M.D.  DATE OF BIRTH:  Dec 13, 1970   DATE OF PROCEDURE:  12/11/2005  DATE OF DISCHARGE:                               OPERATIVE REPORT   PREOPERATIVE DIAGNOSIS:  Interstitial cystitis.   POSTOPERATIVE DIAGNOSIS:  Interstitial cystitis.   PROCEDURES:  1. Cystoscopy.  2. Urethral calibration.  3. Hydrodistention of the bladder, Marcaine and pyridium installation,      Marcaine and Kenalog injection.   SURGEON:  Jamison Neighbor, M.D.   ANESTHESIA:  General.   COMPLICATIONS:  None.   DRAINS:  None.   BRIEF HISTORY:  This 40 year old female has known interstitial cystitis.  The patient has been on a combination of medications, that initially  worked quite nicely.  The patient, therefore, ended up stopping her  Atarax and Elavil, but continued on the Elmiron and has had a  significant deterioration in her symptoms.  After her medications were  restarted, we still could not get her symptoms under control; and she  has requested a repeat hydrodistention be performed.  She had excellent  response the first time that this was done; and really seemed to have a  nice decrease in her symptoms.  She has requested this, with the  understand that there is no guarantee she will have a comparable  response with the second hydrodistention.  She gave full informed  consent.   DESCRIPTION OF PROCEDURE:  After successful induction of general  anesthesia the patient was placed in the dorsal position; prepped with  Betadine and draped in the usual sterile fashion.  Careful bimanual  examination revealed a modest cystocele with no evidence of a  diverticulum.  There is no vault prolapse or rectocele.  The urethra was  calibrated to 32-French with female urethral sounds, but no evidence  of  stenosis or stricture.  The cystoscope was inserted.  The bladder was  carefully inspected.  No tumors or stones could be seen.  Hydrodistention of the bladder was performed.  The bladder was distended  at a pressure of 170 cm of water for 5 minutes when the bladder was  drained ulcerated areas and glomerulation could be seen throughout the  bladder.  This was consistent with interstitial cystitis.  Bladder  capacity was 500 mL which compares to a normal bladder capacity of 1150;  and an  average IC anesthetic capacity of 575.  There was nothing new that  required bladder biopsy.  A mixture of Marcaine and Pyridium was left in  the bladder, Marcaine and Kenalog were injected periurethrally.  The  patient tolerated the procedure well and was taken to the recovery room  in good condition.           ______________________________  Jamison Neighbor, M.D.  Electronically Signed     RJE/MEDQ  D:  12/11/2005  T:  12/11/2005  Job:  [id number]

## 2010-10-02 ENCOUNTER — Other Ambulatory Visit: Payer: Self-pay | Admitting: Internal Medicine

## 2011-02-23 ENCOUNTER — Ambulatory Visit (INDEPENDENT_AMBULATORY_CARE_PROVIDER_SITE_OTHER): Payer: BC Managed Care – PPO | Admitting: Internal Medicine

## 2011-02-23 ENCOUNTER — Encounter: Payer: Self-pay | Admitting: Internal Medicine

## 2011-02-23 VITALS — BP 120/80 | HR 72 | Temp 97.8°F | Wt 137.0 lb

## 2011-02-23 DIAGNOSIS — Z299 Encounter for prophylactic measures, unspecified: Secondary | ICD-10-CM

## 2011-02-23 DIAGNOSIS — L538 Other specified erythematous conditions: Secondary | ICD-10-CM

## 2011-02-23 DIAGNOSIS — L0390 Cellulitis, unspecified: Secondary | ICD-10-CM

## 2011-02-23 DIAGNOSIS — L0291 Cutaneous abscess, unspecified: Secondary | ICD-10-CM

## 2011-02-23 DIAGNOSIS — K921 Melena: Secondary | ICD-10-CM

## 2011-02-23 DIAGNOSIS — L304 Erythema intertrigo: Secondary | ICD-10-CM

## 2011-02-23 NOTE — Patient Instructions (Signed)
This seems like intertigo with is a rash at skin folds that gets bacteria and yeast  Causing inflammation. The trick is to keep skin dry in between  Can use burows soaks or domeboro soaks  To gently clean and dry and the put on antifungal such as monistat cream or lotion. Twice a day for 1 - 2weeks.   We did a bacterial culture today because sometimes bacteria can add to infection and needs further treatment.  Blood in stool is usually not normal   Although constipation could caus a tear that gives blood in urine.  We should have you see a GI doc   For evaluation.

## 2011-02-23 NOTE — Progress Notes (Signed)
  Subjective:    Patient ID: Janet Nichols, female    DOB: May 08, 1970, 41 y.o.   MRN: [id number]  HPI Patient comes in today for SDA  For acute problem evaluation. She has had a red weeping rash in gluteal crease area for a number of weeks  Using otc emmolients no other rx . No NVD contipation or diarreha and no sig vaginal discharge .  Hurts at times  No fever  Has had intermettent blood in stool she describe on tissue but also at times  mixed in stool  No pain or other bleeding.  No hx of colon cancer in family? Asks about mammogram.  No more infections noted.  Due for a mammo and they said need a referral .  And unsure why   Review of Systems No fever cp sob  Edema    Past history family history social history reviewed in the electronic medical record.     Objective:   Physical Exam  WDWN in and Abdomen:  Sof,t normal bowel sounds without hepatosplenomegaly, no guarding rebound or masses no CVA tenderness Skin INtergluteal area shows bright red flat  Well circumscibed rash with some middle fissure clear serous fluid no abscess  Some also around vaginal area but no pustules or vesicles.      Assessment & Plan:  Intergluteal rash  / intertrigo > cause  rx with drying agent and for yeast but culture done to check for staph strep.  Call if worse in t he meantime  Mammo due ? Why would need referral but can do rx  In the meantime and call if needed.  Hx of blood in stool    No other sx   Refer to gi  As discussed

## 2011-02-26 ENCOUNTER — Other Ambulatory Visit: Payer: Self-pay | Admitting: Internal Medicine

## 2011-02-26 LAB — WOUND CULTURE: Gram Stain: NONE SEEN

## 2011-02-26 MED ORDER — AMOXICILLIN 500 MG PO CAPS: 500.0000 mg | ORAL_CAPSULE | Freq: Three times a day (TID) | ORAL | Status: AC

## 2011-02-26 NOTE — Progress Notes (Signed)
Quick Note:  Left message to call back Rx sent to pharmacy. ______

## 2011-02-28 DIAGNOSIS — K921 Melena: Secondary | ICD-10-CM | POA: Insufficient documentation

## 2011-02-28 DIAGNOSIS — L304 Erythema intertrigo: Secondary | ICD-10-CM | POA: Insufficient documentation

## 2011-02-28 DIAGNOSIS — Z299 Encounter for prophylactic measures, unspecified: Secondary | ICD-10-CM | POA: Insufficient documentation

## 2011-03-19 ENCOUNTER — Encounter: Payer: Self-pay | Admitting: *Deleted

## 2011-03-19 NOTE — Progress Notes (Signed)
Quick Note:  Letter sent to pt to call office ______

## 2011-05-11 ENCOUNTER — Encounter: Payer: Self-pay | Admitting: Gastroenterology

## 2011-07-14 ENCOUNTER — Encounter: Payer: Self-pay | Admitting: Internal Medicine

## 2011-07-14 ENCOUNTER — Ambulatory Visit (INDEPENDENT_AMBULATORY_CARE_PROVIDER_SITE_OTHER): Payer: BC Managed Care – PPO | Admitting: Internal Medicine

## 2011-07-14 ENCOUNTER — Other Ambulatory Visit (HOSPITAL_COMMUNITY)
Admission: RE | Admit: 2011-07-14 | Discharge: 2011-07-14 | Disposition: A | Discharge status: Home / Self Care | Payer: BC Managed Care – PPO | Source: Ambulatory Visit | Attending: Internal Medicine | Admitting: Internal Medicine

## 2011-07-14 VITALS — BP 110/80 | HR 65 | Temp 98.0°F | Wt 135.0 lb

## 2011-07-14 DIAGNOSIS — R52 Pain, unspecified: Secondary | ICD-10-CM

## 2011-07-14 DIAGNOSIS — Z1151 Encounter for screening for human papillomavirus (HPV): Secondary | ICD-10-CM | POA: Insufficient documentation

## 2011-07-14 DIAGNOSIS — Z01419 Encounter for gynecological examination (general) (routine) without abnormal findings: Secondary | ICD-10-CM | POA: Insufficient documentation

## 2011-07-14 DIAGNOSIS — Z113 Encounter for screening for infections with a predominantly sexual mode of transmission: Secondary | ICD-10-CM | POA: Insufficient documentation

## 2011-07-14 DIAGNOSIS — R109 Unspecified abdominal pain: Secondary | ICD-10-CM

## 2011-07-14 DIAGNOSIS — R102 Pelvic and perineal pain: Secondary | ICD-10-CM | POA: Insufficient documentation

## 2011-07-14 DIAGNOSIS — N949 Unspecified condition associated with female genital organs and menstrual cycle: Secondary | ICD-10-CM

## 2011-07-14 DIAGNOSIS — N760 Acute vaginitis: Secondary | ICD-10-CM | POA: Insufficient documentation

## 2011-07-14 LAB — POCT URINALYSIS DIP (MANUAL ENTRY)
Ketones, POC UA: NEGATIVE
Leukocytes, UA: NEGATIVE
Nitrite, UA: NEGATIVE
Protein Ur, POC: NEGATIVE
Urobilinogen, UA: 0.2

## 2011-07-14 MED ORDER — DOXYCYCLINE HYCLATE 100 MG PO CAPS: 100.0000 mg | ORAL_CAPSULE | Freq: Two times a day (BID) | ORAL | Status: AC

## 2011-07-14 NOTE — Progress Notes (Signed)
  Subjective:    Patient ID: Janet Nichols, female    DOB: 1970-03-07, 41 y.o.   MRN: [id number]  HPI Patient comes in today for SDA for  new problem evaluation. Onset 3 days ago fairly suddenly of sharp pain in the lower mid abdomen level over the bladder area. Since that time the pain is continued although is much better today than he was when it first started. She took Pepto-Bismol but has had no nausea vomiting change in bowel habits blood in the stool or hematuria. She has a slight vaginal discharge.  Last menstrual period 6:30 normal for her has had some cramping since her procedure appears ago last Pap smear was 2011. Sometimes gets cramps with period but this pain is different. partenr x 19 years  essure procedure 2011 Review of Systems ROS:  GEN/ HEENT: No fever, significant weight changes sweats headaches vision problems hearing changes, CV/ PULM; No chest pain shortness of breath cough, syncope,edema  change in exercise tolerance. GI /GU: No , vomiting, change in bowel habits. No blood in the stool. No significant GU symptoms. SKIN/HEME: ,no acute skin rashes suspicious lesions or bleeding. No lymphadenopathy, nodules, masses.  NEURO/ PSYCH:  No neurologic signs such as weakness numbness. No depression anxiety. IMM/ Allergy: No unusual infections.  REST of 12 system review negative except as per HPI  Past history family history social history reviewed in the electronic medical record.     Objective:   Physical Exam BP 110/80  Pulse 65  Temp 98 F (36.7 C) (Oral)  Wt 135 lb (61.236 kg)  SpO2 99%  LMP 07/05/2011 Neck: Supple without adenopathy or masses or bruits Chest:  Clear to A&P without wheezes rales or rhonchi CV:  S1-S2 no gallops or murmurs peripheral perfusion is normal Abdomen:  Sof,t normal bowel sounds without hepatosplenomegaly, no guarding rebound or masses no CVA tenderness she is tender over the suprapubic area radiates to both pelvic gutters. Negative  psoas sign Urinalysis is clear UCG negative Pelvic: External GU normal cervix somewhat posterior clear to milky discharge flat ectopy no bleeding Pap smear gc chl high risk hpv bv done    bimanual very tender on cervical motion uterine area and adnexa but no masses felt.    Assessment & Plan:   New onset pelvic pain  Not lateralized  And no obv infection  And low risk but has CMT on exam,   Onset scenario seems like ovulatory  pain iwith some improvement however midline location and exam not consistent with such. We'll put her on doxycycline empirically check screening tests and have gynecology see her.

## 2011-07-14 NOTE — Patient Instructions (Addendum)
Your uterus is tender but I don't see signs of infection. However am going to add antibiotic in case.  Will inform you of Pap results when they are available.  Advise we get  A gyne evaluation .   And contact us if worse in the meantime.

## 2011-07-18 NOTE — Progress Notes (Signed)
Quick Note:  Tell patient PAP is normal. Please send copy or results to whichever gyne We referred her to. ______

## 2011-07-20 ENCOUNTER — Encounter: Payer: Self-pay | Admitting: Family Medicine

## 2013-04-05 LAB — HM MAMMOGRAPHY

## 2013-04-11 ENCOUNTER — Encounter: Payer: Self-pay | Admitting: Internal Medicine

## 2013-04-11 ENCOUNTER — Ambulatory Visit (INDEPENDENT_AMBULATORY_CARE_PROVIDER_SITE_OTHER): Payer: BC Managed Care – PPO | Admitting: Internal Medicine

## 2013-04-11 VITALS — BP 112/84 | Temp 97.9°F | Ht 62.0 in | Wt 140.0 lb

## 2013-04-11 DIAGNOSIS — N6049 Mammary duct ectasia of unspecified breast: Secondary | ICD-10-CM

## 2013-04-11 DIAGNOSIS — N6041 Mammary duct ectasia of right breast: Secondary | ICD-10-CM

## 2013-04-11 MED ORDER — DOXYCYCLINE HYCLATE 100 MG PO CAPS: 100.0000 mg | ORAL_CAPSULE | Freq: Two times a day (BID) | ORAL | Status: DC

## 2013-04-11 NOTE — Patient Instructions (Signed)
Treatment for  Infection in the ducts  Of the breast. Warm hot compresses  3 x per day avoiding burning  For 5 minutes or so.  Antibiotic rx . Plan return office visit in about 3 weeks to check breast exam   Earlier if needed.

## 2013-04-11 NOTE — Progress Notes (Signed)
Chief Complaint  Patient presents with  . Breast Pain    Started on Friday.  . Breast Problem    HPI: Patient comes in today for SDA for  new problem evaluation. Has had about 2 weeks of righ tbreast bumps tender that then had pus coming out of nipple at one point .  Tender  In red  A bit better today  No fever recnet antibiotic  Last gyne check dr Deatra Ina also has seen der taavon in the past.  Had mammorgram routine  Didn't report teh breast sx for evaluation  hasnt hear results yet.   . ROS: See pertinent positives and negatives per HPI.  Past Medical History  Diagnosis Date  . IC (interstitial cystitis)   . Gestational diabetes   . Low HDL (under 40)   . Acne   . Rosacea     Family History  Problem Relation Age of Onset  . Hypertension Mother   . Diabetes Mother   . Hypertension Sister   . Breast cancer Cousin     History   Social History  . Marital Status: Married    Spouse Name: N/A    Number of Children: N/A  . Years of Education: N/A   Social History Main Topics  . Smoking status: Never Smoker   . Smokeless tobacco: None  . Alcohol Use:   . Drug Use:   . Sexual Activity:    Other Topics Concern  . None   Social History Narrative   Married   HH of 4   No pets   Sleep 7 hours   40 hours at least   Exercise sometimes          Outpatient Encounter Prescriptions as of 04/11/2013  Medication Sig  . Mirabegron (MYRBETRIQ PO) Take by mouth.  . doxycycline (VIBRAMYCIN) 100 MG capsule Take 1 capsule (100 mg total) by mouth 2 (two) times daily.    EXAM:  BP 112/84  Temp(Src) 97.9 F (36.6 C) (Oral)  Ht 5\' 2"  (1.575 m)  Wt 140 lb (63.504 kg)  BMI 25.60 kg/m2  Body mass index is 25.6 kg/(m^2).  GENERAL: vitals reviewed and listed above, alert, oriented, appears well hydrated and in no acute distress breasts left normal right with 2 2 mm pink red bumps without mass aereolar  nipple minimal redness  couldnot  express discharge ( pat and provider  at this time ) no obv mass and no streaking or  Fullness  axill ais clear . No bruising no breast deformity otherwise  MS: moves all extremities without noticeable focal  abnormality PSYCH: pleasant and cooperative, no obvious depression or anxiety  ASSESSMENT AND PLAN:  Discussed the following assessment and plan:   Periductal mastitis of right breast - infectious  appearing  No obv masses  routine mammo  Not diagnostis mammo will rx for infection and fu exam later  May need more eval if  persistent or progressive  Nothing to culture today -Patient advised to return or notify health care team  if symptoms worsen ,persist or new concerns arise.  Patient Instructions  Treatment for  Infection in the ducts  Of the breast. Warm hot compresses  3 x per day avoiding burning  For 5 minutes or so.  Antibiotic rx . Plan return office visit in about 3 weeks to check breast exam   Earlier if needed.    Standley Brooking. Alfie Rideaux M.D.  Pre visit review using our clinic review tool, if applicable. No  additional management support is needed unless otherwise documented below in the visit note.

## 2013-04-17 ENCOUNTER — Encounter: Payer: Self-pay | Admitting: Internal Medicine

## 2013-05-08 ENCOUNTER — Encounter: Payer: Self-pay | Admitting: Internal Medicine

## 2013-05-26 ENCOUNTER — Encounter: Payer: Self-pay | Admitting: Internal Medicine

## 2013-05-26 ENCOUNTER — Ambulatory Visit (INDEPENDENT_AMBULATORY_CARE_PROVIDER_SITE_OTHER): Payer: BC Managed Care – PPO | Admitting: Internal Medicine

## 2013-05-26 VITALS — BP 132/92 | Temp 98.9°F | Wt 138.0 lb

## 2013-05-26 DIAGNOSIS — N63 Unspecified lump in unspecified breast: Secondary | ICD-10-CM

## 2013-05-26 MED ORDER — CEPHALEXIN 500 MG PO CAPS: 500.0000 mg | ORAL_CAPSULE | Freq: Three times a day (TID) | ORAL | Status: DC

## 2013-05-26 NOTE — Progress Notes (Signed)
Chief Complaint  Patient presents with  . Breast Problem    Patient has a breast lump on her left breast.    HPI:  Fu problem with breast see April 7 note bacteria resolved fairly quickly with antibiotics and warm compresses. Within a week. However over the last weeks about one and a half has developed a tender small lump around the left nipple. No fever no discharge no trauma. Last menstrual period was about 2 weeks ago can't really related to her period. No fever or. ROS: See pertinent positives and negatives per HPI. No systemic symptoms or. Irregularities. No rashes in the area little itchy spot mid chest  Past Medical History  Diagnosis Date  . IC (interstitial cystitis)   . Gestational diabetes   . Low HDL (under 40)   . Acne   . Rosacea     Family History  Problem Relation Age of Onset  . Hypertension Mother   . Diabetes Mother   . Hypertension Sister   . Breast cancer Cousin     History   Social History  . Marital Status: Married    Spouse Name: N/A    Number of Children: N/A  . Years of Education: N/A   Social History Main Topics  . Smoking status: Never Smoker   . Smokeless tobacco: None  . Alcohol Use:   . Drug Use:   . Sexual Activity:    Other Topics Concern  . None   Social History Narrative   Married   HH of 4   No pets   Sleep 7 hours   40 hours at least   Exercise sometimes          Outpatient Encounter Prescriptions as of 05/26/2013  Medication Sig  . Mirabegron (MYRBETRIQ PO) Take by mouth.  . cephALEXin (KEFLEX) 500 MG capsule Take 1 capsule (500 mg total) by mouth 3 (three) times daily.  . [DISCONTINUED] doxycycline (VIBRAMYCIN) 100 MG capsule Take 1 capsule (100 mg total) by mouth 2 (two) times daily.    EXAM:  BP 132/92  Temp(Src) 98.9 F (37.2 C) (Oral)  Wt 138 lb (62.596 kg)  Body mass index is 25.23 kg/(m^2).  GENERAL: vitals reviewed and listed above, alert, oriented, appears well hydrated and in no acute  distress HEENT: atraumatic, conjunctiva  clear, no obvious abnormalities on inspection of external nose and ears NECK: no obvious masses on inspection palpation  Breast exam right breast no nodules noted bumps and pustular area are resolved on the right adnexal is clear left breast no dimpling there is a small seedlike tender area about 6:00 on the areola area  Mobile no discharge axilla clear  MS: moves all extremities without noticeable focal  abnormality PSYCH: pleasant and cooperative, no obvious depression or anxiety  ASSESSMENT AND PLAN:  Discussed the following assessment and plan:  Breast lump in female left  - Tender new area history of infection around breast without persistence. This is new warm compresses antibiotic case could be made for following next cycle Previous area totally resolved this is a new area. Followup if persistent progressive at this time I don't think other intervention if current in same place or nodules today we can get an ultrasound and followup. Patient aware -Patient advised to return or notify health care team  if symptoms worsen ,persist or new concerns arise.  Patient Instructions  antibiotic and warm compresses . If  persistent or progressive   Over a month  Let us reexamine  this  Uncertain how to prevent this at this time .    Standley Brooking. Janet Nichols M.D.  Pre visit review using our clinic review tool, if applicable. No additional management support is needed unless otherwise documented below in the visit note.

## 2013-05-26 NOTE — Patient Instructions (Signed)
antibiotic and warm compresses . If  persistent or progressive   Over a month  Let us reexamine this  Uncertain how to prevent this at this time .

## 2013-09-05 ENCOUNTER — Other Ambulatory Visit (HOSPITAL_COMMUNITY)
Admission: RE | Admit: 2013-09-05 | Discharge: 2013-09-05 | Disposition: A | Discharge status: Home / Self Care | Payer: BC Managed Care – PPO | Source: Ambulatory Visit | Attending: Internal Medicine | Admitting: Internal Medicine

## 2013-09-05 ENCOUNTER — Encounter: Payer: BC Managed Care – PPO | Admitting: Internal Medicine

## 2013-09-05 ENCOUNTER — Ambulatory Visit (INDEPENDENT_AMBULATORY_CARE_PROVIDER_SITE_OTHER): Payer: BC Managed Care – PPO | Admitting: Internal Medicine

## 2013-09-05 ENCOUNTER — Encounter: Payer: Self-pay | Admitting: Internal Medicine

## 2013-09-05 VITALS — BP 108/70 | HR 68 | Temp 98.1°F | Ht 62.0 in | Wt 141.0 lb

## 2013-09-05 DIAGNOSIS — Z01419 Encounter for gynecological examination (general) (routine) without abnormal findings: Secondary | ICD-10-CM

## 2013-09-05 DIAGNOSIS — Z2821 Immunization not carried out because of patient refusal: Secondary | ICD-10-CM

## 2013-09-05 DIAGNOSIS — Z1151 Encounter for screening for human papillomavirus (HPV): Secondary | ICD-10-CM | POA: Diagnosis present

## 2013-09-05 DIAGNOSIS — Z0000 Encounter for general adult medical examination without abnormal findings: Secondary | ICD-10-CM

## 2013-09-05 DIAGNOSIS — E786 Lipoprotein deficiency: Secondary | ICD-10-CM

## 2013-09-05 DIAGNOSIS — R7309 Other abnormal glucose: Secondary | ICD-10-CM

## 2013-09-05 LAB — BASIC METABOLIC PANEL
BUN: 10 mg/dL (ref 6–23)
CO2: 27 meq/L (ref 19–32)
Calcium: 8.8 mg/dL (ref 8.4–10.5)
Chloride: 106 mEq/L (ref 96–112)
Creatinine, Ser: 0.6 mg/dL (ref 0.4–1.2)
GFR: 111.08 mL/min (ref >60.00)
GLUCOSE: 86 mg/dL (ref 70–99)
POTASSIUM: 3.5 meq/L (ref 3.5–5.1)
SODIUM: 140 meq/L (ref 135–145)

## 2013-09-05 LAB — LIPID PANEL
Cholesterol: 141 mg/dL (ref 0–200)
HDL: 32.6 mg/dL — ABNORMAL LOW (ref >39.00)
LDL Cholesterol: 84 mg/dL (ref 0–99)
NONHDL: 108.4
Total CHOL/HDL Ratio: 4
Triglycerides: 123 mg/dL (ref 0.0–149.0)
VLDL: 24.6 mg/dL (ref 0.0–40.0)

## 2013-09-05 LAB — CBC WITH DIFFERENTIAL/PLATELET
Basophils Absolute: 0.1 10*3/uL (ref 0.0–0.1)
Basophils Relative: 0.6 % (ref 0.0–3.0)
Eosinophils Absolute: 0.2 10*3/uL (ref 0.0–0.7)
Eosinophils Relative: 1.9 % (ref 0.0–5.0)
HEMATOCRIT: 40.9 % (ref 36.0–46.0)
HEMOGLOBIN: 13.8 g/dL (ref 12.0–15.0)
LYMPHS ABS: 2.1 10*3/uL (ref 0.7–4.0)
Lymphocytes Relative: 25 % (ref 12.0–46.0)
MCHC: 33.7 g/dL (ref 30.0–36.0)
MCV: 92.2 fl (ref 78.0–100.0)
MONOS PCT: 8.4 % (ref 3.0–12.0)
Monocytes Absolute: 0.7 10*3/uL (ref 0.1–1.0)
NEUTROS ABS: 5.5 10*3/uL (ref 1.4–7.7)
Neutrophils Relative %: 64.1 % (ref 43.0–77.0)
Platelets: 262 10*3/uL (ref 150.0–400.0)
RBC: 4.44 Mil/uL (ref 3.87–5.11)
RDW: 13.4 % (ref 11.5–15.5)
WBC: 8.6 10*3/uL (ref 4.0–10.5)

## 2013-09-05 LAB — TSH: TSH: 0.49 u[IU]/mL (ref 0.35–4.50)

## 2013-09-05 LAB — HEPATIC FUNCTION PANEL
ALK PHOS: 48 U/L (ref 39–117)
ALT: 14 U/L (ref 0–35)
AST: 19 U/L (ref 0–37)
Albumin: 4.1 g/dL (ref 3.5–5.2)
Bilirubin, Direct: 0.1 mg/dL (ref 0.0–0.3)
TOTAL PROTEIN: 7.6 g/dL (ref 6.0–8.3)
Total Bilirubin: 0.9 mg/dL (ref 0.2–1.2)

## 2013-09-05 LAB — HEMOGLOBIN A1C: Hgb A1c MFr Bld: 6.2 % (ref 4.6–6.5)

## 2013-09-05 NOTE — Progress Notes (Signed)
Pre visit review using our clinic review tool, if applicable. No additional management support is needed unless otherwise documented below in the visit note. 

## 2013-09-05 NOTE — Progress Notes (Signed)
Chief Complaint  Patient presents with  . Annual Exam    HPI: Patient comes in today for Preventive Health Care visit  No concern today under rx for roascea  Topical  metroig Due pap ?  No abnormal  hasnt seen gyne in a while no abnormals   No breast issues today Bladder   Stable sees urologist  Health Maintenance  Topic Date Due  . Influenza Vaccine  09/06/2014 (Originally 08/05/2013)  . Mammogram  04/06/2014  . Pap Smear  07/14/2014  . Tetanus/tdap  07/18/2017   Health Maintenance Review LIFESTYLE:  Exercise:  Not much  Tobacco/ETS: no Alcohol: no  Sugar beverages:no  Sleep: 7 hours  Drug use: no Contraception: ligation 2011  ROS:  GEN/ HEENT: No fever, significant weight changes sweats headaches vision problems hearing changes, CV/ PULM; No chest pain shortness of breath cough, syncope,edema  change in exercise tolerance. GI /GU: No adominal pain, vomiting, change in bowel habits. No blood in the stool.  oon med for baldder SKIN/HEME: ,no  suspicious lesions or bleeding. No lymphadenopathy, nodules, masses.  Rash under breast rx and better  No sweating anymore  NEURO/ PSYCH:  No neurologic signs such as weakness numbness. No depression anxiety. IMM/ Allergy: No unusual infections.  Allergy .   REST of 12 system review negative except as per HPI   Past Medical History  Diagnosis Date  . IC (interstitial cystitis)   . Gestational diabetes   . Low HDL (under 40)   . Acne   . Rosacea     Family History  Problem Relation Age of Onset  . Hypertension Mother   . Diabetes Mother   . Hypertension Sister   . Breast cancer Cousin     History   Social History  . Marital Status: Married    Spouse Name: N/A    Number of Children: N/A  . Years of Education: N/A   Social History Main Topics  . Smoking status: Never Smoker   . Smokeless tobacco: None  . Alcohol Use:   . Drug Use:   . Sexual Activity:    Other Topics Concern  . None   Social History  Narrative   Married   HH of 4       No pets   Sleep 7 hours   40 hours at least 10 hours days mon th friday   Exercise sometimes             Outpatient Encounter Prescriptions as of 09/05/2013  Medication Sig  . Mirabegron (MYRBETRIQ PO) Take by mouth.  . metroNIDAZOLE (METROGEL) 0.75 % gel   . [DISCONTINUED] cephALEXin (KEFLEX) 500 MG capsule Take 1 capsule (500 mg total) by mouth 3 (three) times daily.    EXAM:  BP 108/70  Pulse 68  Temp(Src) 98.1 F (36.7 C) (Oral)  Ht 5\' 2"  (1.575 m)  Wt 141 lb (63.957 kg)  BMI 25.78 kg/m2  LMP 07/21/2013  Body mass index is 25.78 kg/(m^2).  Physical Exam: Vital signs reviewed [id number] is a well-developed well-nourished alert obv facial erythema  Rosacea  cooperative    who appearsr stated age in no acute distress.  HEENT: normocephalic atraumatic , Eyes: PERRL EOM's full, conjunctiva clear, Nares: paten,t no deformity discharge or tenderness., Ears: no deformity EAC's clear TMs with normal landmarks. Mouth: clear OP, no lesions, edema.  Moist mucous membranes. Dentition in adequate repair. NECK: supple without masses, thyromegaly or bruits. CHEST/PULM:  Clear to auscultation and percussion breath sounds  equal no wheeze , rales or rhonchi. No chest wall deformities or tenderness. Breast: normal by inspection . No dimpling, discharge, masses, tenderness or discharge .old scar left ( preb cyst abscess) CV: PMI is nondisplaced, S1 S2 no gallops, murmurs, rubs. Peripheral pulses are full without delay.No JVD .  ABDOMEN: Bowel sounds normal nontender  No guard or rebound, no hepato splenomegal no CVA tenderness.  No hernia. Extremtities:  No clubbing cyanosis or edema, no acute joint swelling or redness no focal atrophy NEURO:  Oriented x3, cranial nerves 3-12 appear to be intact, no obvious focal weakness,gait within normal limits no abnormal reflexes or asymmetrical SKIN: No acute rashes normal turgor, color, no bruising or  petechiae. PSYCH: Oriented, good eye contact, no obvious depression anxiety, cognition and judgment appear normal. LN: no cervical axillary inguinal adenopathy Pelvic: NL ext GU, labia clear without lesions or rash . Vagina no lesions .Cervix: clear  Parous.  UTERUS: Neg CMT Adnexa:  clear no masses . PAP done with hpv.   Lab Results  Component Value Date   WBC 8.6 09/05/2013   HGB 13.8 09/05/2013   HCT 40.9 09/05/2013   PLT 262.0 09/05/2013   GLUCOSE 86 09/05/2013   CHOL 141 09/05/2013   TRIG 123.0 09/05/2013   HDL 32.60* 09/05/2013   LDLCALC 84 09/05/2013   ALT 14 09/05/2013   AST 19 09/05/2013   NA 140 09/05/2013   K 3.5 09/05/2013   CL 106 09/05/2013   CREATININE 0.6 09/05/2013   BUN 10 09/05/2013   CO2 27 09/05/2013   TSH 0.49 09/05/2013   HGBA1C 6.2 09/05/2013    ASSESSMENT AND PLAN:  Discussed the following assessment and plan:  Visit for preventive health examination - Plan: Basic metabolic panel, CBC with Differential, Hemoglobin A1c, Hepatic function panel, Lipid panel, TSH, PAP [[place]]  Encounter for routine gynecological examination - pap and hpv high risk - Plan: PAP [[place]]  HYPERGLYCEMIA - hx gest dm check lab today lsi - Plan: Basic metabolic panel, CBC with Differential, Hemoglobin A1c, Hepatic function panel, Lipid panel, TSH  Low HDL (under 40) - Plan: Basic metabolic panel, CBC with Differential, Hemoglobin A1c, Hepatic function panel, Lipid panel, TSH  Influenza vaccination declined by patient Will notify you  of labs/pap when available. Disc ILSI Healthy lifestyle includes : At least 150 minutes of exercise weeks  , weight at healthy levels, which is usually   BMI 19-25. Avoid trans fats and processed foods;  Increase fresh fruits and veges to 5 servings per day. And avoid sweet beverages including tea and juice. Mediterranean diet with olive oil and nuts have been noted to be heart and brain healthy . Avoid tobacco products . Limit  alcohol to  7 per week for women and 14  servings for men.  Get adequate sleep .  We are checking  . For diabetes also  Patient Care Team: Burnis Medin, MD as PCP - General Marlou Starks, MD as Referring Physician (Urology) Garald Balding, MD (Orthopedic Surgery) Lovenia Kim, MD as Attending Physician (Obstetrics and Gynecology) dermatologist

## 2013-09-05 NOTE — Patient Instructions (Addendum)
Will notify you  of labs/pap when available.  Healthy lifestyle includes : At least 150 minutes of exercise weeks  , weight at healthy levels, which is usually   BMI 19-25. Avoid trans fats and processed foods;  Increase fresh fruits and veges to 5 servings per day. And avoid sweet beverages including tea and juice. Mediterranean diet with olive oil and nuts have been noted to be heart and brain healthy . Avoid tobacco products . Limit  alcohol to  7 per week for women and 14 servings for men.  Get adequate sleep .  We are checking  . For diabetes also  Preventive Care for Adults A healthy lifestyle and preventive care can promote health and wellness. Preventive health guidelines for women include the following key practices.  A routine yearly physical is a good way to check with your health care provider about your health and preventive screening. It is a chance to share any concerns and updates on your health and to receive a thorough exam.  Visit your dentist for a routine exam and preventive care every 6 months. Brush your teeth twice a day and floss once a day. Good oral hygiene prevents tooth decay and gum disease.  The frequency of eye exams is based on your age, health, family medical history, use of contact lenses, and other factors. Follow your health care provider's recommendations for frequency of eye exams.  Eat a healthy diet. Foods like vegetables, fruits, whole grains, low-fat dairy products, and lean protein foods contain the nutrients you need without too many calories. Decrease your intake of foods high in solid fats, added sugars, and salt. Eat the right amount of calories for you.Get information about a proper diet from your health care provider, if necessary.  Regular physical exercise is one of the most important things you can do for your health. Most adults should get at least 150 minutes of moderate-intensity exercise (any activity that increases your heart rate and  causes you to sweat) each week. In addition, most adults need muscle-strengthening exercises on 2 or more days a week.  Maintain a healthy weight. The body mass index (BMI) is a screening tool to identify possible weight problems. It provides an estimate of body fat based on height and weight. Your health care provider can find your BMI and can help you achieve or maintain a healthy weight.For adults 20 years and older:  A BMI below 18.5 is considered underweight.  A BMI of 18.5 to 24.9 is normal.  A BMI of 25 to 29.9 is considered overweight.  A BMI of 30 and above is considered obese.  Maintain normal blood lipids and cholesterol levels by exercising and minimizing your intake of saturated fat. Eat a balanced diet with plenty of fruit and vegetables. Blood tests for lipids and cholesterol should begin at age 89 and be repeated every 5 years. If your lipid or cholesterol levels are high, you are over 50, or you are at high risk for heart disease, you may need your cholesterol levels checked more frequently.Ongoing high lipid and cholesterol levels should be treated with medicines if diet and exercise are not working.  If you smoke, find out from your health care provider how to quit. If you do not use tobacco, do not start.  Lung cancer screening is recommended for adults aged 50-80 years who are at high risk for developing lung cancer because of a history of smoking. A yearly low-dose CT scan of the lungs is recommended  for people who have at least a 30-pack-year history of smoking and are a current smoker or have quit within the past 15 years. A pack year of smoking is smoking an average of 1 pack of cigarettes a day for 1 year (for example: 1 pack a day for 30 years or 2 packs a day for 15 years). Yearly screening should continue until the smoker has stopped smoking for at least 15 years. Yearly screening should be stopped for people who develop a health problem that would prevent them from  having lung cancer treatment.  If you are pregnant, do not drink alcohol. If you are breastfeeding, be very cautious about drinking alcohol. If you are not pregnant and choose to drink alcohol, do not have more than 1 drink per day. One drink is considered to be 12 ounces (355 mL) of beer, 5 ounces (148 mL) of wine, or 1.5 ounces (44 mL) of liquor.  Avoid use of street drugs. Do not share needles with anyone. Ask for help if you need support or instructions about stopping the use of drugs.  High blood pressure causes heart disease and increases the risk of stroke. Your blood pressure should be checked at least every 1 to 2 years. Ongoing high blood pressure should be treated with medicines if weight loss and exercise do not work.  If you are 33-48 years old, ask your health care provider if you should take aspirin to prevent strokes.  Diabetes screening involves taking a blood sample to check your fasting blood sugar level. This should be done once every 3 years, after age 71, if you are within normal weight and without risk factors for diabetes. Testing should be considered at a younger age or be carried out more frequently if you are overweight and have at least 1 risk factor for diabetes.  Breast cancer screening is essential preventive care for women. You should practice "breast self-awareness." This means understanding the normal appearance and feel of your breasts and may include breast self-examination. Any changes detected, no matter how small, should be reported to a health care provider. Women in their 30s and 30s should have a clinical breast exam (CBE) by a health care provider as part of a regular health exam every 1 to 3 years. After age 40, women should have a CBE every year. Starting at age 31, women should consider having a mammogram (breast X-ray test) every year. Women who have a family history of breast cancer should talk to their health care provider about genetic screening. Women at  a high risk of breast cancer should talk to their health care providers about having an MRI and a mammogram every year.  Breast cancer gene (BRCA)-related cancer risk assessment is recommended for women who have family members with BRCA-related cancers. BRCA-related cancers include breast, ovarian, tubal, and peritoneal cancers. Having family members with these cancers may be associated with an increased risk for harmful changes (mutations) in the breast cancer genes BRCA1 and BRCA2. Results of the assessment will determine the need for genetic counseling and BRCA1 and BRCA2 testing.  Routine pelvic exams to screen for cancer are no longer recommended for nonpregnant women who are considered low risk for cancer of the pelvic organs (ovaries, uterus, and vagina) and who do not have symptoms. Ask your health care provider if a screening pelvic exam is right for you.  If you have had past treatment for cervical cancer or a condition that could lead to cancer, you need Pap  tests and screening for cancer for at least 20 years after your treatment. If Pap tests have been discontinued, your risk factors (such as having a new sexual partner) need to be reassessed to determine if screening should be resumed. Some women have medical problems that increase the chance of getting cervical cancer. In these cases, your health care provider may recommend more frequent screening and Pap tests.  The HPV test is an additional test that may be used for cervical cancer screening. The HPV test looks for the virus that can cause the cell changes on the cervix. The cells collected during the Pap test can be tested for HPV. The HPV test could be used to screen women aged 35 years and older, and should be used in women of any age who have unclear Pap test results. After the age of 84, women should have HPV testing at the same frequency as a Pap test.  Colorectal cancer can be detected and often prevented. Most routine colorectal  cancer screening begins at the age of 69 years and continues through age 3 years. However, your health care provider may recommend screening at an earlier age if you have risk factors for colon cancer. On a yearly basis, your health care provider may provide home test kits to check for hidden blood in the stool. Use of a small camera at the end of a tube, to directly examine the colon (sigmoidoscopy or colonoscopy), can detect the earliest forms of colorectal cancer. Talk to your health care provider about this at age 30, when routine screening begins. Direct exam of the colon should be repeated every 5-10 years through age 72 years, unless early forms of pre-cancerous polyps or small growths are found.  People who are at an increased risk for hepatitis B should be screened for this virus. You are considered at high risk for hepatitis B if:  You were born in a country where hepatitis B occurs often. Talk with your health care provider about which countries are considered high risk.  Your parents were born in a high-risk country and you have not received a shot to protect against hepatitis B (hepatitis B vaccine).  You have HIV or AIDS.  You use needles to inject street drugs.  You live with, or have sex with, someone who has hepatitis B.  You get hemodialysis treatment.  You take certain medicines for conditions like cancer, organ transplantation, and autoimmune conditions.  Hepatitis C blood testing is recommended for all people born from [birth year] through 1966 and any individual with known risks for hepatitis C.  Practice safe sex. Use condoms and avoid high-risk sexual practices to reduce the spread of sexually transmitted infections (STIs). STIs include gonorrhea, chlamydia, syphilis, trichomonas, herpes, HPV, and human immunodeficiency virus (HIV). Herpes, HIV, and HPV are viral illnesses that have no cure. They can result in disability, cancer, and death.  You should be screened for sexually  transmitted illnesses (STIs) including gonorrhea and chlamydia if:  You are sexually active and are younger than 24 years.  You are older than 24 years and your health care provider tells you that you are at risk for this type of infection.  Your sexual activity has changed since you were last screened and you are at an increased risk for chlamydia or gonorrhea. Ask your health care provider if you are at risk.  If you are at risk of being infected with HIV, it is recommended that you take a prescription medicine daily to prevent  HIV infection. This is called preexposure prophylaxis (PrEP). You are considered at risk if:  You are a heterosexual woman, are sexually active, and are at increased risk for HIV infection.  You take drugs by injection.  You are sexually active with a partner who has HIV.  Talk with your health care provider about whether you are at high risk of being infected with HIV. If you choose to begin PrEP, you should first be tested for HIV. You should then be tested every 3 months for as long as you are taking PrEP.  Osteoporosis is a disease in which the bones lose minerals and strength with aging. This can result in serious bone fractures or breaks. The risk of osteoporosis can be identified using a bone density scan. Women ages 103 years and over and women at risk for fractures or osteoporosis should discuss screening with their health care providers. Ask your health care provider whether you should take a calcium supplement or vitamin D to reduce the rate of osteoporosis.  Menopause can be associated with physical symptoms and risks. Hormone replacement therapy is available to decrease symptoms and risks. You should talk to your health care provider about whether hormone replacement therapy is right for you.  Use sunscreen. Apply sunscreen liberally and repeatedly throughout the day. You should seek shade when your shadow is shorter than you. Protect yourself by wearing  long sleeves, pants, a wide-brimmed hat, and sunglasses year round, whenever you are outdoors.  Once a month, do a whole body skin exam, using a mirror to look at the skin on your back. Tell your health care provider of new moles, moles that have irregular borders, moles that are larger than a pencil eraser, or moles that have changed in shape or color.  Stay current with required vaccines (immunizations).  Influenza vaccine. All adults should be immunized every year.  Tetanus, diphtheria, and acellular pertussis (Td, Tdap) vaccine. Pregnant women should receive 1 dose of Tdap vaccine during each pregnancy. The dose should be obtained regardless of the length of time since the last dose. Immunization is preferred during the 27th-36th week of gestation. An adult who has not previously received Tdap or who does not know her vaccine status should receive 1 dose of Tdap. This initial dose should be followed by tetanus and diphtheria toxoids (Td) booster doses every 10 years. Adults with an unknown or incomplete history of completing a 3-dose immunization series with Td-containing vaccines should begin or complete a primary immunization series including a Tdap dose. Adults should receive a Td booster every 10 years.  Varicella vaccine. An adult without evidence of immunity to varicella should receive 2 doses or a second dose if she has previously received 1 dose. Pregnant females who do not have evidence of immunity should receive the first dose after pregnancy. This first dose should be obtained before leaving the health care facility. The second dose should be obtained 4-8 weeks after the first dose.  Human papillomavirus (HPV) vaccine. Females aged 13-26 years who have not received the vaccine previously should obtain the 3-dose series. The vaccine is not recommended for use in pregnant females. However, pregnancy testing is not needed before receiving a dose. If a female is found to be pregnant after  receiving a dose, no treatment is needed. In that case, the remaining doses should be delayed until after the pregnancy. Immunization is recommended for any person with an immunocompromised condition through the age of 18 years if she did not get  any or all doses earlier. During the 3-dose series, the second dose should be obtained 4-8 weeks after the first dose. The third dose should be obtained 24 weeks after the first dose and 16 weeks after the second dose.  Zoster vaccine. One dose is recommended for adults aged 56 years or older unless certain conditions are present.  Measles, mumps, and rubella (MMR) vaccine. Adults born before [birth year] generally are considered immune to measles and mumps. Adults born in [birth year] or later should have 1 or more doses of MMR vaccine unless there is a contraindication to the vaccine or there is laboratory evidence of immunity to each of the three diseases. A routine second dose of MMR vaccine should be obtained at least 28 days after the first dose for students attending postsecondary schools, health care workers, or international travelers. People who received inactivated measles vaccine or an unknown type of measles vaccine during 1963-1967 should receive 2 doses of MMR vaccine. People who received inactivated mumps vaccine or an unknown type of mumps vaccine before 1979 and are at high risk for mumps infection should consider immunization with 2 doses of MMR vaccine. For females of childbearing age, rubella immunity should be determined. If there is no evidence of immunity, females who are not pregnant should be vaccinated. If there is no evidence of immunity, females who are pregnant should delay immunization until after pregnancy. Unvaccinated health care workers born before [birth year] who lack laboratory evidence of measles, mumps, or rubella immunity or laboratory confirmation of disease should consider measles and mumps immunization with 2 doses of MMR vaccine or rubella  immunization with 1 dose of MMR vaccine.  Pneumococcal 13-valent conjugate (PCV13) vaccine. When indicated, a person who is uncertain of her immunization history and has no record of immunization should receive the PCV13 vaccine. An adult aged 20 years or older who has certain medical conditions and has not been previously immunized should receive 1 dose of PCV13 vaccine. This PCV13 should be followed with a dose of pneumococcal polysaccharide (PPSV23) vaccine. The PPSV23 vaccine dose should be obtained at least 8 weeks after the dose of PCV13 vaccine. An adult aged 73 years or older who has certain medical conditions and previously received 1 or more doses of PPSV23 vaccine should receive 1 dose of PCV13. The PCV13 vaccine dose should be obtained 1 or more years after the last PPSV23 vaccine dose.  Pneumococcal polysaccharide (PPSV23) vaccine. When PCV13 is also indicated, PCV13 should be obtained first. All adults aged 67 years and older should be immunized. An adult younger than age 80 years who has certain medical conditions should be immunized. Any person who resides in a nursing home or long-term care facility should be immunized. An adult smoker should be immunized. People with an immunocompromised condition and certain other conditions should receive both PCV13 and PPSV23 vaccines. People with human immunodeficiency virus (HIV) infection should be immunized as soon as possible after diagnosis. Immunization during chemotherapy or radiation therapy should be avoided. Routine use of PPSV23 vaccine is not recommended for American Indians, [place] Natives, or people younger than 65 years unless there are medical conditions that require PPSV23 vaccine. When indicated, people who have unknown immunization and have no record of immunization should receive PPSV23 vaccine. One-time revaccination 5 years after the first dose of PPSV23 is recommended for people aged 19-64 years who have chronic kidney failure,  nephrotic syndrome, asplenia, or immunocompromised conditions. People who received 1-2 doses of PPSV23 before age 36 years should receive  another dose of PPSV23 vaccine at age 16 years or later if at least 5 years have passed since the previous dose. Doses of PPSV23 are not needed for people immunized with PPSV23 at or after age 37 years.  Meningococcal vaccine. Adults with asplenia or persistent complement component deficiencies should receive 2 doses of quadrivalent meningococcal conjugate (MenACWY-D) vaccine. The doses should be obtained at least 2 months apart. Microbiologists working with certain meningococcal bacteria, [place] recruits, people at risk during an outbreak, and people who travel to or live in countries with a high rate of meningitis should be immunized. A first-year college student up through age 70 years who is living in a residence hall should receive a dose if she did not receive a dose on or after her 16th birthday. Adults who have certain high-risk conditions should receive one or more doses of vaccine.  Hepatitis A vaccine. Adults who wish to be protected from this disease, have certain high-risk conditions, work with hepatitis A-infected animals, work in hepatitis A research labs, or travel to or work in countries with a high rate of hepatitis A should be immunized. Adults who were previously unvaccinated and who anticipate close contact with an international adoptee during the first 60 days after arrival in the [place] from a country with a high rate of hepatitis A should be immunized.  Hepatitis B vaccine. Adults who wish to be protected from this disease, have certain high-risk conditions, may be exposed to blood or other infectious body fluids, are household contacts or sex partners of hepatitis B positive people, are clients or workers in certain care facilities, or travel to or work in countries with a high rate of hepatitis B should be immunized.  Haemophilus  influenzae type b (Hib) vaccine. A previously unvaccinated person with asplenia or sickle cell disease or having a scheduled splenectomy should receive 1 dose of Hib vaccine. Regardless of previous immunization, a recipient of a hematopoietic stem cell transplant should receive a 3-dose series 6-12 months after her successful transplant. Hib vaccine is not recommended for adults with HIV infection. Preventive Services / Frequency Ages 37 to 45 years  Blood pressure check.** / Every 1 to 2 years.  Lipid and cholesterol check.** / Every 5 years beginning at age 78.  Clinical breast exam.** / Every 3 years for women in their 60s and 50s.  BRCA-related cancer risk assessment.** / For women who have family members with a BRCA-related cancer (breast, ovarian, tubal, or peritoneal cancers).  Pap test.** / Every 2 years from ages 63 through 24. Every 3 years starting at age 57 through age 67 or 38 with a history of 3 consecutive normal Pap tests.  HPV screening.** / Every 3 years from ages 61 through ages 34 to 30 with a history of 3 consecutive normal Pap tests.  Hepatitis C blood test.** / For any individual with known risks for hepatitis C.  Skin self-exam. / Monthly.  Influenza vaccine. / Every year.  Tetanus, diphtheria, and acellular pertussis (Tdap, Td) vaccine.** / Consult your health care provider. Pregnant women should receive 1 dose of Tdap vaccine during each pregnancy. 1 dose of Td every 10 years.  Varicella vaccine.** / Consult your health care provider. Pregnant females who do not have evidence of immunity should receive the first dose after pregnancy.  HPV vaccine. / 3 doses over 6 months, if 50 and younger. The vaccine is not recommended for use in pregnant females. However, pregnancy testing is not needed before receiving  a dose.  Measles, mumps, rubella (MMR) vaccine.** / You need at least 1 dose of MMR if you were born in 1957 or later. You may also need a 2nd dose. For  females of childbearing age, rubella immunity should be determined. If there is no evidence of immunity, females who are not pregnant should be vaccinated. If there is no evidence of immunity, females who are pregnant should delay immunization until after pregnancy.  Pneumococcal 13-valent conjugate (PCV13) vaccine.** / Consult your health care provider.  Pneumococcal polysaccharide (PPSV23) vaccine.** / 1 to 2 doses if you smoke cigarettes or if you have certain conditions.  Meningococcal vaccine.** / 1 dose if you are age 35 to 8 years and a Market researcher living in a residence hall, or have one of several medical conditions, you need to get vaccinated against meningococcal disease. You may also need additional booster doses.  Hepatitis A vaccine.** / Consult your health care provider.  Hepatitis B vaccine.** / Consult your health care provider.  Haemophilus influenzae type b (Hib) vaccine.** / Consult your health care provider. Ages 47 to 43 years  Blood pressure check.** / Every 1 to 2 years.  Lipid and cholesterol check.** / Every 5 years beginning at age 76 years.  Lung cancer screening. / Every year if you are aged 62-80 years and have a 30-pack-year history of smoking and currently smoke or have quit within the past 15 years. Yearly screening is stopped once you have quit smoking for at least 15 years or develop a health problem that would prevent you from having lung cancer treatment.  Clinical breast exam.** / Every year after age 25 years.  BRCA-related cancer risk assessment.** / For women who have family members with a BRCA-related cancer (breast, ovarian, tubal, or peritoneal cancers).  Mammogram.** / Every year beginning at age 50 years and continuing for as long as you are in good health. Consult with your health care provider.  Pap test.** / Every 3 years starting at age 56 years through age 30 or 10 years with a history of 3 consecutive normal Pap  tests.  HPV screening.** / Every 3 years from ages 88 years through ages 4 to 90 years with a history of 3 consecutive normal Pap tests.  Fecal occult blood test (FOBT) of stool. / Every year beginning at age 76 years and continuing until age 58 years. You may not need to do this test if you get a colonoscopy every 10 years.  Flexible sigmoidoscopy or colonoscopy.** / Every 5 years for a flexible sigmoidoscopy or every 10 years for a colonoscopy beginning at age 40 years and continuing until age 48 years.  Hepatitis C blood test.** / For all people born from [birth year] through 1965 and any individual with known risks for hepatitis C.  Skin self-exam. / Monthly.  Influenza vaccine. / Every year.  Tetanus, diphtheria, and acellular pertussis (Tdap/Td) vaccine.** / Consult your health care provider. Pregnant women should receive 1 dose of Tdap vaccine during each pregnancy. 1 dose of Td every 10 years.  Varicella vaccine.** / Consult your health care provider. Pregnant females who do not have evidence of immunity should receive the first dose after pregnancy.  Zoster vaccine.** / 1 dose for adults aged 62 years or older.  Measles, mumps, rubella (MMR) vaccine.** / You need at least 1 dose of MMR if you were born in 1957 or later. You may also need a 2nd dose. For females of childbearing age, rubella immunity should  be determined. If there is no evidence of immunity, females who are not pregnant should be vaccinated. If there is no evidence of immunity, females who are pregnant should delay immunization until after pregnancy.  Pneumococcal 13-valent conjugate (PCV13) vaccine.** / Consult your health care provider.  Pneumococcal polysaccharide (PPSV23) vaccine.** / 1 to 2 doses if you smoke cigarettes or if you have certain conditions.  Meningococcal vaccine.** / Consult your health care provider.  Hepatitis A vaccine.** / Consult your health care provider.  Hepatitis B vaccine.** / Consult your  health care provider.  Haemophilus influenzae type b (Hib) vaccine.** / Consult your health care provider. Ages 12 years and over  Blood pressure check.** / Every 1 to 2 years.  Lipid and cholesterol check.** / Every 5 years beginning at age 13 years.  Lung cancer screening. / Every year if you are aged 51-80 years and have a 30-pack-year history of smoking and currently smoke or have quit within the past 15 years. Yearly screening is stopped once you have quit smoking for at least 15 years or develop a health problem that would prevent you from having lung cancer treatment.  Clinical breast exam.** / Every year after age 20 years.  BRCA-related cancer risk assessment.** / For women who have family members with a BRCA-related cancer (breast, ovarian, tubal, or peritoneal cancers).  Mammogram.** / Every year beginning at age 50 years and continuing for as long as you are in good health. Consult with your health care provider.  Pap test.** / Every 3 years starting at age 63 years through age 65 or 69 years with 3 consecutive normal Pap tests. Testing can be stopped between 65 and 70 years with 3 consecutive normal Pap tests and no abnormal Pap or HPV tests in the past 10 years.  HPV screening.** / Every 3 years from ages 5 years through ages 43 or 74 years with a history of 3 consecutive normal Pap tests. Testing can be stopped between 65 and 70 years with 3 consecutive normal Pap tests and no abnormal Pap or HPV tests in the past 10 years.  Fecal occult blood test (FOBT) of stool. / Every year beginning at age 92 years and continuing until age 31 years. You may not need to do this test if you get a colonoscopy every 10 years.  Flexible sigmoidoscopy or colonoscopy.** / Every 5 years for a flexible sigmoidoscopy or every 10 years for a colonoscopy beginning at age 30 years and continuing until age 41 years.  Hepatitis C blood test.** / For all people born from [birth year] through 1966 and any  individual with known risks for hepatitis C.  Osteoporosis screening.** / A one-time screening for women ages 35 years and over and women at risk for fractures or osteoporosis.  Skin self-exam. / Monthly.  Influenza vaccine. / Every year.  Tetanus, diphtheria, and acellular pertussis (Tdap/Td) vaccine.** / 1 dose of Td every 10 years.  Varicella vaccine.** / Consult your health care provider.  Zoster vaccine.** / 1 dose for adults aged 69 years or older.  Pneumococcal 13-valent conjugate (PCV13) vaccine.** / Consult your health care provider.  Pneumococcal polysaccharide (PPSV23) vaccine.** / 1 dose for all adults aged 53 years and older.  Meningococcal vaccine.** / Consult your health care provider.  Hepatitis A vaccine.** / Consult your health care provider.  Hepatitis B vaccine.** / Consult your health care provider.  Haemophilus influenzae type b (Hib) vaccine.** / Consult your health care provider. ** Family history and personal  history of risk and conditions may change your health care provider's recommendations. Document Released: 02/17/2001 Document Revised: 05/08/2013 Document Reviewed: 05/19/2010 [facility] Patient Information 2015 [place], [place]. This information is not intended to replace advice given to you by your health care provider. Make sure you discuss any questions you have with your health care provider.

## 2013-09-07 LAB — CYTOLOGY - PAP

## 2013-09-08 NOTE — Progress Notes (Signed)
Quick Note:  Tell patient PAP is normal. HPV high risk is negative ______ 

## 2013-11-06 ENCOUNTER — Encounter: Payer: Self-pay | Admitting: Internal Medicine

## 2014-12-17 ENCOUNTER — Telehealth: Payer: Self-pay | Admitting: Internal Medicine

## 2014-12-17 NOTE — Telephone Encounter (Signed)
Patient Name: Janet Nichols  DOB: April 09, 1970    Initial Comment Caller states, feels she is very thirsty for the last 2 weeks    Nurse Assessment  Nurse: Raphael Gibney, RN, Vanita Ingles Date/Time (Eastern Time): 12/17/2014 11:07:53 AM  Confirm and document reason for call. If symptomatic, describe symptoms. ---Caller states she has been thirsty for 2 weeks. Drinking a lot of water Has to drink bottles of water at night. Having to urinate a lot more. No pain. Lips are dry.  Has the patient traveled out of the country within the last 30 days? ---Not Applicable  Does the patient have any new or worsening symptoms? ---Yes  Will a triage be completed? ---Yes  Related visit to physician within the last 2 weeks? ---No  Does the PT have any chronic conditions? (i.e. diabetes, asthma, etc.) ---No  Did the patient indicate they were pregnant? ---No  Is this a behavioral health or substance abuse call? ---No     Guidelines    Guideline Title Affirmed Question Affirmed Notes  Urinary Symptoms Urinating more frequently than usual (i.e., frequency)    Final Disposition User   See Physician within 24 Hours Stringer, RN, Vanita Ingles    Comments  Caller states she has to work today and tomorrow and would prefer to be seen Wednesday or Thursday. Please call pt back regarding appt.   Referrals  GO TO FACILITY REFUSED   Disagree/Comply: Disagree  Disagree/Comply Reason: Disagree with instructions

## 2014-12-17 NOTE — Telephone Encounter (Signed)
Can we please schedule patient an appointment to see Dr. Sarajane Jews in Dr. Velora Mediate absence

## 2014-12-18 NOTE — Telephone Encounter (Signed)
lmovm to call and schedule an appt  °

## 2014-12-19 ENCOUNTER — Encounter: Payer: Self-pay | Admitting: Family Medicine

## 2014-12-19 ENCOUNTER — Ambulatory Visit (INDEPENDENT_AMBULATORY_CARE_PROVIDER_SITE_OTHER): Payer: 59 | Admitting: Family Medicine

## 2014-12-19 VITALS — BP 117/81 | HR 74 | Temp 98.8°F | Ht 62.0 in | Wt 142.0 lb

## 2014-12-19 DIAGNOSIS — R631 Polydipsia: Secondary | ICD-10-CM

## 2014-12-19 DIAGNOSIS — R7309 Other abnormal glucose: Secondary | ICD-10-CM | POA: Diagnosis not present

## 2014-12-19 LAB — POCT URINALYSIS DIPSTICK
BILIRUBIN UA: NEGATIVE
GLUCOSE UA: NEGATIVE
KETONES UA: NEGATIVE
Leukocytes, UA: NEGATIVE
Nitrite, UA: NEGATIVE
Protein, UA: NEGATIVE
SPEC GRAV UA: 1.02
UROBILINOGEN UA: 0.2
pH, UA: 5.5

## 2014-12-19 LAB — GLUCOSE, POCT (MANUAL RESULT ENTRY): POC Glucose: 127 mg/dl — AB (ref 70–99)

## 2014-12-19 NOTE — Progress Notes (Signed)
Pre visit review using our clinic review tool, if applicable. No additional management support is needed unless otherwise documented below in the visit note. 

## 2014-12-19 NOTE — Progress Notes (Signed)
   Subjective:    Patient ID: Janet Nichols, female    DOB: 03-22-1970, 44 y.o.   MRN: [id number]  HPI Here to discuss one week of increased thirst and increased frequency of urinations. She drinks lots of water but still feels thirsty. Otherwise she feels fine. She has a hx of elevated glucoses in the past but her last A1c in Sept. 2015 was normal. Of note her mother is diabetic.    Review of Systems  Constitutional: Negative.   Respiratory: Negative.   Cardiovascular: Negative.   Gastrointestinal: Negative.   Endocrine: Positive for polydipsia and polyuria. Negative for cold intolerance and heat intolerance.  Genitourinary: Positive for frequency. Negative for dysuria, urgency, hematuria, flank pain and pelvic pain.       Objective:   Physical Exam  Constitutional: She is oriented to person, place, and time. She appears well-developed and well-nourished.  Neck: No thyromegaly present.  Cardiovascular: Normal rate, regular rhythm, normal heart sounds and intact distal pulses.   Pulmonary/Chest: Effort normal and breath sounds normal.  Abdominal: Soft. Bowel sounds are normal. She exhibits no distension and no mass. There is no tenderness. There is no rebound and no guarding.  Lymphadenopathy:    She has no cervical adenopathy.  Neurological: She is alert and oriented to person, place, and time.          Assessment & Plan:  Her random glucose here today is 127. Her symptoms are highly suspicious for diabetes, so we will get labs today including an A1c. She will drink lots of water and avoid sweets tonight.

## 2014-12-20 LAB — HEMOGLOBIN A1C: HEMOGLOBIN A1C: 6 % (ref 4.6–6.5)

## 2014-12-20 LAB — HEPATIC FUNCTION PANEL
ALBUMIN: 4.4 g/dL (ref 3.5–5.2)
ALK PHOS: 47 U/L (ref 39–117)
ALT: 14 U/L (ref 0–35)
AST: 20 U/L (ref 0–37)
Bilirubin, Direct: 0.2 mg/dL (ref 0.0–0.3)
Total Bilirubin: 0.8 mg/dL (ref 0.2–1.2)
Total Protein: 7.7 g/dL (ref 6.0–8.3)

## 2014-12-20 LAB — BASIC METABOLIC PANEL
BUN: 9 mg/dL (ref 6–23)
CALCIUM: 9.2 mg/dL (ref 8.4–10.5)
CO2: 30 mEq/L (ref 19–32)
Chloride: 105 mEq/L (ref 96–112)
Creatinine, Ser: 0.7 mg/dL (ref 0.40–1.20)
GFR: 96 mL/min (ref >60.00)
GLUCOSE: 144 mg/dL — AB (ref 70–99)
POTASSIUM: 3.6 meq/L (ref 3.5–5.1)
SODIUM: 139 meq/L (ref 135–145)

## 2014-12-20 LAB — CBC WITH DIFFERENTIAL/PLATELET
BASOS ABS: 0.1 10*3/uL (ref 0.0–0.1)
Basophils Relative: 0.7 % (ref 0.0–3.0)
EOS PCT: 1.2 % (ref 0.0–5.0)
Eosinophils Absolute: 0.1 10*3/uL (ref 0.0–0.7)
HCT: 41.2 % (ref 36.0–46.0)
HEMOGLOBIN: 13.8 g/dL (ref 12.0–15.0)
Lymphocytes Relative: 21.1 % (ref 12.0–46.0)
Lymphs Abs: 2.5 10*3/uL (ref 0.7–4.0)
MCHC: 33.5 g/dL (ref 30.0–36.0)
MCV: 91 fl (ref 78.0–100.0)
MONO ABS: 0.6 10*3/uL (ref 0.1–1.0)
Monocytes Relative: 5 % (ref 3.0–12.0)
Neutro Abs: 8.4 10*3/uL — ABNORMAL HIGH (ref 1.4–7.7)
Neutrophils Relative %: 72 % (ref 43.0–77.0)
Platelets: 263 10*3/uL (ref 150.0–400.0)
RBC: 4.52 Mil/uL (ref 3.87–5.11)
RDW: 13.4 % (ref 11.5–15.5)
WBC: 11.6 10*3/uL — AB (ref 4.0–10.5)

## 2014-12-20 LAB — TSH: TSH: 0.55 u[IU]/mL (ref 0.35–4.50)

## 2015-05-27 ENCOUNTER — Other Ambulatory Visit (INDEPENDENT_AMBULATORY_CARE_PROVIDER_SITE_OTHER): Payer: 59

## 2015-05-27 DIAGNOSIS — Z0000 Encounter for general adult medical examination without abnormal findings: Secondary | ICD-10-CM

## 2015-05-27 LAB — LIPID PANEL
CHOL/HDL RATIO: 4
Cholesterol: 136 mg/dL (ref 0–200)
HDL: 31.5 mg/dL — ABNORMAL LOW (ref >39.00)
LDL CALC: 85 mg/dL (ref 0–99)
NonHDL: 104.23
TRIGLYCERIDES: 95 mg/dL (ref 0.0–149.0)
VLDL: 19 mg/dL (ref 0.0–40.0)

## 2015-05-27 LAB — CBC WITH DIFFERENTIAL/PLATELET
BASOS PCT: 0.6 % (ref 0.0–3.0)
Basophils Absolute: 0 10*3/uL (ref 0.0–0.1)
EOS PCT: 1.9 % (ref 0.0–5.0)
Eosinophils Absolute: 0.1 10*3/uL (ref 0.0–0.7)
HEMATOCRIT: 39 % (ref 36.0–46.0)
Hemoglobin: 13.3 g/dL (ref 12.0–15.0)
LYMPHS ABS: 2 10*3/uL (ref 0.7–4.0)
LYMPHS PCT: 30.6 % (ref 12.0–46.0)
MCHC: 34.1 g/dL (ref 30.0–36.0)
MCV: 89.3 fl (ref 78.0–100.0)
MONOS PCT: 9.7 % (ref 3.0–12.0)
Monocytes Absolute: 0.6 10*3/uL (ref 0.1–1.0)
NEUTROS ABS: 3.7 10*3/uL (ref 1.4–7.7)
NEUTROS PCT: 57.2 % (ref 43.0–77.0)
PLATELETS: 228 10*3/uL (ref 150.0–400.0)
RBC: 4.37 Mil/uL (ref 3.87–5.11)
RDW: 13.1 % (ref 11.5–15.5)
WBC: 6.5 10*3/uL (ref 4.0–10.5)

## 2015-05-27 LAB — HEPATIC FUNCTION PANEL
ALK PHOS: 52 U/L (ref 39–117)
ALT: 29 U/L (ref 0–35)
AST: 24 U/L (ref 0–37)
Albumin: 4.2 g/dL (ref 3.5–5.2)
BILIRUBIN DIRECT: 0.1 mg/dL (ref 0.0–0.3)
TOTAL PROTEIN: 7.1 g/dL (ref 6.0–8.3)
Total Bilirubin: 0.5 mg/dL (ref 0.2–1.2)

## 2015-05-27 LAB — BASIC METABOLIC PANEL
BUN: 11 mg/dL (ref 6–23)
CALCIUM: 8.9 mg/dL (ref 8.4–10.5)
CHLORIDE: 108 meq/L (ref 96–112)
CO2: 25 meq/L (ref 19–32)
Creatinine, Ser: 0.67 mg/dL (ref 0.40–1.20)
GFR: 100.78 mL/min (ref >60.00)
GLUCOSE: 109 mg/dL — AB (ref 70–99)
POTASSIUM: 3.7 meq/L (ref 3.5–5.1)
SODIUM: 138 meq/L (ref 135–145)

## 2015-05-27 LAB — TSH: TSH: 0.53 u[IU]/mL (ref 0.35–4.50)

## 2015-05-30 NOTE — Progress Notes (Signed)
Chief Complaint  Patient presents with  . Annual Exam    HPI: Patient  Janet Nichols  45 y.o. comes in today for Preventive Health Care visit   Health Maintenance  Topic Date Due  . MAMMOGRAM  04/06/2014  . HIV Screening  05/22/2016 (Originally 07/23/1985)  . INFLUENZA VACCINE  08/06/2015  . PAP SMEAR  09/05/2016  . TETANUS/TDAP  07/18/2017   Health Maintenance Review LIFESTYLE:  Exercise:   Off and on . Tobacco/ETS: no Alcohol:  no Sugar beverages: no Sleep:  About 6-7  Drug use: no H H of 4   2 sons   Normal   periods  Once a month      ROS:  GEN/ HEENT: No fever, significant weight changes sweats headaches vision problems hearing changes, CV/ PULM; No chest pain shortness of breath cough, syncope,edema  change in exercise tolerance. GI /GU: No adominal pain, vomiting, change in bowel habits. No blood in the stool. No significant GU symptoms. SKIN/HEME: ,nosuspicious lesions or bleeding. No lymphadenopathy, nodules, masses.  NEURO/ PSYCH:  No neurologic signs such as weakness numbness. No depression anxiety. IMM/ Allergy: No unusual infections.  Allergy .   REST of 12 system review negative except as per HPI   Past Medical History  Diagnosis Date  . IC (interstitial cystitis)   . Gestational diabetes   . Low HDL (under 40)   . Acne   . Rosacea     Past Surgical History  Procedure Laterality Date  . Bladder stretched    . Essure tubal ligation  2011    Family History  Problem Relation Age of Onset  . Hypertension Mother   . Diabetes Mother   . Hypertension Sister   . Breast cancer Cousin   . Kidney failure Mother     mom on dialysis has dn    Social History   Social History  . Marital Status: Married    Spouse Name: N/A  . Number of Children: N/A  . Years of Education: N/A   Social History Main Topics  . Smoking status: Never Smoker   . Smokeless tobacco: Never Used  . Alcohol Use: No  . Drug Use: No  . Sexual Activity: Not Asked    Other Topics Concern  . None   Social History Narrative   Married   HH of 4       No pets   Sleep 7 hours   40 hours at least 10 hours days mon th friday   Exercise sometimes             Outpatient Prescriptions Prior to Visit  Medication Sig Dispense Refill  . metroNIDAZOLE (METROGEL) 0.75 % gel Reported on 12/19/2014    . Mirabegron (MYRBETRIQ PO) Take by mouth. Reported on 05/31/2015     No facility-administered medications prior to visit.     EXAM:  BP 108/80 mmHg  Pulse 68  Temp(Src) 98.1 F (36.7 C) (Oral)  Ht '5\' 2"'$  (1.575 m)  Wt 138 lb 6.4 oz (62.778 kg)  BMI 25.31 kg/m2  SpO2 98%  LMP 05/10/2015  Body mass index is 25.31 kg/(m^2).  Physical Exam: Vital signs reviewed HGD:JMEQ is a well-developed well-nourished alert cooperative    who appearsr stated age in no acute distress.  HEENT: normocephalic atraumatic , Eyes: PERRL EOM's full, conjunctiva clear, Nares: paten,t no deformity discharge or tenderness., Ears: no deformity EAC's clear TMs with normal landmarks. Mouth: clear OP, no lesions, edema.  Moist mucous  membranes. Dentition in adequate repair. NECK: supple without masses, thyromegaly or bruits. CHEST/PULM:  Clear to auscultation and percussion breath sounds equal no wheeze , rales or rhonchi. No chest wall deformities or tenderness.Breast: normal by inspection . No dimpling, discharge, masses, tenderness or discharge . CV: PMI is nondisplaced, S1 S2 no gallops, murmurs, rubs. Peripheral pulses are full without delay.No JVD .  ABDOMEN: Bowel sounds normal nontender  No guard or rebound, no hepato splenomegal no CVA tenderness.  No hernia. Extremtities:  No clubbing cyanosis or edema, no acute joint swelling or redness no focal atrophy NEURO:  Oriented x3, cranial nerves 3-12 appear to be intact, no obvious focal weakness,gait within normal limits no abnormal reflexes or asymmetrical SKIN: fading rash under left breast with satellite lesion no  bllisgers normal turgor, color, no bruising or petechiae. PSYCH: Oriented, good eye contact, no obvious depression anxiety, cognition and judgment appear normal. LN: no cervical axillary inguinal adenopathy  Lab Results  Component Value Date   WBC 6.5 05/27/2015   HGB 13.3 05/27/2015   HCT 39.0 05/27/2015   PLT 228.0 05/27/2015   GLUCOSE 109* 05/27/2015   CHOL 136 05/27/2015   TRIG 95.0 05/27/2015   HDL 31.50* 05/27/2015   LDLCALC 85 05/27/2015   ALT 29 05/27/2015   AST 24 05/27/2015   NA 138 05/27/2015   K 3.7 05/27/2015   CL 108 05/27/2015   CREATININE 0.67 05/27/2015   BUN 11 05/27/2015   CO2 25 05/27/2015   TSH 0.53 05/27/2015   HGBA1C 6.1 05/31/2015    ASSESSMENT AND PLAN:  Discussed the following assessment and plan:  Visit for preventive health examination  Low HDL (under 40)  HYPERGLYCEMIA - at risk pre diabetic dis lsi and yearly lab a1c - Plan: Hemoglobin A1c, Hemoglobin A1c, POCT Hemoglobin-Hemocue, POCT Hemoglobin-Hemocue, POC HgB A1c  Intertrigo See instructions for plan  Patient Care Team: Madelin Headings, MD as PCP - General Jonette Mate, MD as Referring Physician (Urology) Valeria Batman, MD (Orthopedic Surgery) Olivia Mackie, MD as Attending Physician (Obstetrics and Gynecology) dermatologist  Patient Instructions     Keep area under breast dry   Use OTC miconazole or  clotrimazole twice a day for up to 2 weeks   In addition can add 1% hydrocortisone   2-3 x per day if needed for itching .  Keeping dry should help it heal .  Intensify lifestyle interventions. As we discussed  Pick up exrecise   Decrease simple carbohydrates such as rice .  important to control sugar to not develop diabetes.  Can  Consider adding  Metformin if sugar not controlled.   Keep BP in normal range is also healthy for your    Get your mammogram    Health Maintenance, Female Adopting a healthy lifestyle and getting preventive care can go a long way to promote  health and wellness. Talk with your health care provider about what schedule of regular examinations is right for you. This is a good chance for you to check in with your provider about disease prevention and staying healthy. In between checkups, there are plenty of things you can do on your own. Experts have done a lot of research about which lifestyle changes and preventive measures are most likely to keep you healthy. Ask your health care provider for more information. WEIGHT AND DIET  Eat a healthy diet  Be sure to include plenty of vegetables, fruits, low-fat dairy products, and lean protein.  Do not eat a  lot of foods high in solid fats, added sugars, or salt.  Get regular exercise. This is one of the most important things you can do for your health.  Most adults should exercise for at least 150 minutes each week. The exercise should increase your heart rate and make you sweat (moderate-intensity exercise).  Most adults should also do strengthening exercises at least twice a week. This is in addition to the moderate-intensity exercise.  Maintain a healthy weight  Body mass index (BMI) is a measurement that can be used to identify possible weight problems. It estimates body fat based on height and weight. Your health care provider can help determine your BMI and help you achieve or maintain a healthy weight.  For females 17 years of age and older:   A BMI below 18.5 is considered underweight.  A BMI of 18.5 to 24.9 is normal.  A BMI of 25 to 29.9 is considered overweight.  A BMI of 30 and above is considered obese.  Watch levels of cholesterol and blood lipids  You should start having your blood tested for lipids and cholesterol at 46 years of age, then have this test every 5 years.  You may need to have your cholesterol levels checked more often if:  Your lipid or cholesterol levels are high.  You are older than 46 years of age.  You are at high risk for heart disease.   CANCER SCREENING   Lung Cancer  Lung cancer screening is recommended for adults 19-32 years old who are at high risk for lung cancer because of a history of smoking.  A yearly low-dose CT scan of the lungs is recommended for people who:  Currently smoke.  Have quit within the past 15 years.  Have at least a 30-pack-year history of smoking. A pack year is smoking an average of one pack of cigarettes a day for 1 year.  Yearly screening should continue until it has been 15 years since you quit.  Yearly screening should stop if you develop a health problem that would prevent you from having lung cancer treatment.  Breast Cancer  Practice breast self-awareness. This means understanding how your breasts normally appear and feel.  It also means doing regular breast self-exams. Let your health care provider know about any changes, no matter how small.  If you are in your 20s or 30s, you should have a clinical breast exam (CBE) by a health care provider every 1-3 years as part of a regular health exam.  If you are 72 or older, have a CBE every year. Also consider having a breast X-ray (mammogram) every year.  If you have a family history of breast cancer, talk to your health care provider about genetic screening.  If you are at high risk for breast cancer, talk to your health care provider about having an MRI and a mammogram every year.  Breast cancer gene (BRCA) assessment is recommended for women who have family members with BRCA-related cancers. BRCA-related cancers include:  Breast.  Ovarian.  Tubal.  Peritoneal cancers.  Results of the assessment will determine the need for genetic counseling and BRCA1 and BRCA2 testing. Cervical Cancer Your health care provider may recommend that you be screened regularly for cancer of the pelvic organs (ovaries, uterus, and vagina). This screening involves a pelvic examination, including checking for microscopic changes to the surface of  your cervix (Pap test). You may be encouraged to have this screening done every 3 years, beginning at age 59.  For women ages 61-65, health care providers may recommend pelvic exams and Pap testing every 3 years, or they may recommend the Pap and pelvic exam, combined with testing for human papilloma virus (HPV), every 5 years. Some types of HPV increase your risk of cervical cancer. Testing for HPV may also be done on women of any age with unclear Pap test results.  Other health care providers may not recommend any screening for nonpregnant women who are considered low risk for pelvic cancer and who do not have symptoms. Ask your health care provider if a screening pelvic exam is right for you.  If you have had past treatment for cervical cancer or a condition that could lead to cancer, you need Pap tests and screening for cancer for at least 20 years after your treatment. If Pap tests have been discontinued, your risk factors (such as having a new sexual partner) need to be reassessed to determine if screening should resume. Some women have medical problems that increase the chance of getting cervical cancer. In these cases, your health care provider may recommend more frequent screening and Pap tests. Colorectal Cancer  This type of cancer can be detected and often prevented.  Routine colorectal cancer screening usually begins at 46 years of age and continues through 46 years of age.  Your health care provider may recommend screening at an earlier age if you have risk factors for colon cancer.  Your health care provider may also recommend using home test kits to check for hidden blood in the stool.  A small camera at the end of a tube can be used to examine your colon directly (sigmoidoscopy or colonoscopy). This is done to check for the earliest forms of colorectal cancer.  Routine screening usually begins at age 11.  Direct examination of the colon should be repeated every 5-10 years  through 46 years of age. However, you may need to be screened more often if early forms of precancerous polyps or small growths are found. Skin Cancer  Check your skin from head to toe regularly.  Tell your health care provider about any new moles or changes in moles, especially if there is a change in a mole's shape or color.  Also tell your health care provider if you have a mole that is larger than the size of a pencil eraser.  Always use sunscreen. Apply sunscreen liberally and repeatedly throughout the day.  Protect yourself by wearing long sleeves, pants, a wide-brimmed hat, and sunglasses whenever you are outside. HEART DISEASE, DIABETES, AND HIGH BLOOD PRESSURE   High blood pressure causes heart disease and increases the risk of stroke. High blood pressure is more likely to develop in:  People who have blood pressure in the high end of the normal range (130-139/85-89 mm Hg).  People who are overweight or obese.  People who are African American.  If you are 21-67 years of age, have your blood pressure checked every 3-5 years. If you are 48 years of age or older, have your blood pressure checked every year. You should have your blood pressure measured twice--once when you are at a hospital or clinic, and once when you are not at a hospital or clinic. Record the average of the two measurements. To check your blood pressure when you are not at a hospital or clinic, you can use:  An automated blood pressure machine at a pharmacy.  A home blood pressure monitor.  If you are between 55 years and 79 years  old, ask your health care provider if you should take aspirin to prevent strokes.  Have regular diabetes screenings. This involves taking a blood sample to check your fasting blood sugar level.  If you are at a normal weight and have a low risk for diabetes, have this test once every three years after 46 years of age.  If you are overweight and have a high risk for diabetes,  consider being tested at a younger age or more often. PREVENTING INFECTION  Hepatitis B  If you have a higher risk for hepatitis B, you should be screened for this virus. You are considered at high risk for hepatitis B if:  You were born in a country where hepatitis B is common. Ask your health care provider which countries are considered high risk.  Your parents were born in a high-risk country, and you have not been immunized against hepatitis B (hepatitis B vaccine).  You have HIV or AIDS.  You use needles to inject street drugs.  You live with someone who has hepatitis B.  You have had sex with someone who has hepatitis B.  You get hemodialysis treatment.  You take certain medicines for conditions, including cancer, organ transplantation, and autoimmune conditions. Hepatitis C  Blood testing is recommended for:  Everyone born from [birth year] through 1966.  Anyone with known risk factors for hepatitis C. Sexually transmitted infections (STIs)  You should be screened for sexually transmitted infections (STIs) including gonorrhea and chlamydia if:  You are sexually active and are younger than 46 years of age.  You are older than 46 years of age and your health care provider tells you that you are at risk for this type of infection.  Your sexual activity has changed since you were last screened and you are at an increased risk for chlamydia or gonorrhea. Ask your health care provider if you are at risk.  If you do not have HIV, but are at risk, it may be recommended that you take a prescription medicine daily to prevent HIV infection. This is called pre-exposure prophylaxis (PrEP). You are considered at risk if:  You are sexually active and do not regularly use condoms or know the HIV status of your partner(s).  You take drugs by injection.  You are sexually active with a partner who has HIV. Talk with your health care provider about whether you are at high risk of being  infected with HIV. If you choose to begin PrEP, you should first be tested for HIV. You should then be tested every 3 months for as long as you are taking PrEP.  PREGNANCY   If you are premenopausal and you may become pregnant, ask your health care provider about preconception counseling.  If you may become pregnant, take 400 to 800 micrograms (mcg) of folic acid every day.  If you want to prevent pregnancy, talk to your health care provider about birth control (contraception). OSTEOPOROSIS AND MENOPAUSE   Osteoporosis is a disease in which the bones lose minerals and strength with aging. This can result in serious bone fractures. Your risk for osteoporosis can be identified using a bone density scan.  If you are 1 years of age or older, or if you are at risk for osteoporosis and fractures, ask your health care provider if you should be screened.  Ask your health care provider whether you should take a calcium or vitamin D supplement to lower your risk for osteoporosis.  Menopause may have certain physical symptoms and  risks.  Hormone replacement therapy may reduce some of these symptoms and risks. Talk to your health care provider about whether hormone replacement therapy is right for you.  HOME CARE INSTRUCTIONS   Schedule regular health, dental, and eye exams.  Stay current with your immunizations.   Do not use any tobacco products including cigarettes, chewing tobacco, or electronic cigarettes.  If you are pregnant, do not drink alcohol.  If you are breastfeeding, limit how much and how often you drink alcohol.  Limit alcohol intake to no more than 1 drink per day for nonpregnant women. One drink equals 12 ounces of beer, 5 ounces of wine, or 1 ounces of hard liquor.  Do not use street drugs.  Do not share needles.  Ask your health care provider for help if you need support or information about quitting drugs.  Tell your health care provider if you often feel  depressed.  Tell your health care provider if you have ever been abused or do not feel safe at home.   This information is not intended to replace advice given to you by your health care provider. Make sure you discuss any questions you have with your health care provider.   Document Released: 07/07/2010 Document Revised: 01/12/2014 Document Reviewed: 11/23/2012 Elsevier Interactive Patient Education [employer].    Why follow it? Research shows. . Those who follow the Mediterranean diet have a reduced risk of heart disease  . The diet is associated with a reduced incidence of Parkinson's and Alzheimer's diseases . People following the diet may have longer life expectancies and lower rates of chronic diseases  . The Dietary Guidelines for Americans recommends the Mediterranean diet as an eating plan to promote health and prevent disease  What Is the Mediterranean Diet?  . Healthy eating plan based on typical foods and recipes of Mediterranean-style cooking . The diet is primarily a plant based diet; these foods should make up a majority of meals   Starches - Plant based foods should make up a majority of meals - They are an important sources of vitamins, minerals, energy, antioxidants, and fiber - Choose whole grains, foods high in fiber and minimally processed items  - Typical grain sources include wheat, oats, barley, corn, brown rice, bulgar, farro, millet, polenta, couscous  - Various types of beans include chickpeas, lentils, fava beans, black beans, white beans   Fruits  Veggies - Large quantities of antioxidant rich fruits & veggies; 6 or more servings  - Vegetables can be eaten raw or lightly drizzled with oil and cooked  - Vegetables common to the traditional Mediterranean Diet include: artichokes, arugula, beets, broccoli, brussel sprouts, cabbage, carrots, celery, collard greens, cucumbers, eggplant, kale, leeks, lemons, lettuce, mushrooms, okra, onions, peas, peppers,  potatoes, pumpkin, radishes, rutabaga, shallots, spinach, sweet potatoes, turnips, zucchini - Fruits common to the Mediterranean Diet include: apples, apricots, avocados, cherries, clementines, dates, figs, grapefruits, grapes, melons, nectarines, oranges, peaches, pears, pomegranates, strawberries, tangerines  Fats - Replace butter and margarine with healthy oils, such as olive oil, canola oil, and tahini  - Limit nuts to no more than a handful a day  - Nuts include walnuts, almonds, pecans, pistachios, pine nuts  - Limit or avoid candied, honey roasted or heavily salted nuts - Olives are central to the Mediterranean diet - can be eaten whole or used in a variety of dishes   Meats Protein - Limiting red meat: no more than a few times a month - When eating red  meat: choose lean cuts and keep the portion to the size of deck of cards - Eggs: approx. 0 to 4 times a week  - Fish and lean poultry: at least 2 a week  - Healthy protein sources include, chicken, [place], lean beef, lamb - Increase intake of seafood such as tuna, salmon, trout, mackerel, shrimp, scallops - Avoid or limit high fat processed meats such as sausage and bacon  Dairy - Include moderate amounts of low fat dairy products  - Focus on healthy dairy such as fat free yogurt, skim milk, low or reduced fat cheese - Limit dairy products higher in fat such as whole or 2% milk, cheese, ice cream  Alcohol - Moderate amounts of red wine is ok  - No more than 5 oz daily for women (all ages) and men older than age 38  - No more than 10 oz of wine daily for men younger than 84  Other - Limit sweets and other desserts  - Use herbs and spices instead of salt to flavor foods  - Herbs and spices common to the traditional Mediterranean Diet include: basil, bay leaves, chives, cloves, cumin, fennel, garlic, lavender, marjoram, mint, oregano, parsley, pepper, rosemary, sage, savory, sumac, tarragon, thyme   It's not just a diet, it's a lifestyle:   . The Mediterranean diet includes lifestyle factors typical of those in the region  . Foods, drinks and meals are best eaten with others and savored . Daily physical activity is important for overall good health . This could be strenuous exercise like running and aerobics . This could also be more leisurely activities such as walking, housework, yard-work, or taking the stairs . Moderation is the key; a balanced and healthy diet accommodates most foods and drinks . Consider portion sizes and frequency of consumption of certain foods   Meal Ideas & Options:  . Breakfast:  o Whole wheat toast or whole wheat English muffins with peanut butter & hard boiled egg o Steel cut oats topped with apples & cinnamon and skim milk  o Fresh fruit: banana, strawberries, melon, berries, peaches  o Smoothies: strawberries, bananas, greek yogurt, peanut butter o Low fat greek yogurt with blueberries and granola  o Egg white omelet with spinach and mushrooms o Breakfast couscous: whole wheat couscous, apricots, skim milk, cranberries  . Sandwiches:  o Hummus and grilled vegetables (peppers, zucchini, squash) on whole wheat bread   o Grilled chicken on whole wheat pita with lettuce, tomatoes, cucumbers or tzatziki  o Tuna salad on whole wheat bread: tuna salad made with greek yogurt, olives, red peppers, capers, green onions o Garlic rosemary lamb pita: lamb sauted with garlic, rosemary, salt & pepper; add lettuce, cucumber, greek yogurt to pita - flavor with lemon juice and black pepper  . Seafood:  o Mediterranean grilled salmon, seasoned with garlic, basil, parsley, lemon juice and black pepper o Shrimp, lemon, and spinach whole-grain pasta salad made with low fat greek yogurt  o Seared scallops with lemon orzo  o Seared tuna steaks seasoned salt, pepper, coriander topped with tomato mixture of olives, tomatoes, olive oil, minced garlic, parsley, green onions and cappers  . Meats:  o Herbed greek  chicken salad with kalamata olives, cucumber, feta  o Red bell peppers stuffed with spinach, bulgur, lean ground beef (or lentils) & topped with feta   o Kebabs: skewers of chicken, tomatoes, onions, zucchini, squash  o [place] burgers: made with red onions, mint, dill, lemon juice, feta cheese topped with  roasted red peppers . Vegetarian o Cucumber salad: cucumbers, artichoke hearts, celery, red onion, feta cheese, tossed in olive oil & lemon juice  o Hummus and whole grain pita points with a greek salad (lettuce, tomato, feta, olives, cucumbers, red onion) o Lentil soup with celery, carrots made with vegetable broth, garlic, salt and pepper  o Tabouli salad: parsley, bulgur, mint, scallions, cucumbers, tomato, radishes, lemon juice, olive oil, salt and pepper.       Intertrigo Intertrigo is a skin condition that occurs in between folds of skin in places on the body that rub together a lot and do not get much ventilation. It is caused by heat, moisture, friction, sweat retention, and lack of air circulation, which produces red, irritated patches and, sometimes, scaling or drainage. People who have diabetes, who are obese, or who have treatment with antibiotics are at increased risk for intertrigo. The most common sites for intertrigo to occur include:  The groin.  The breasts.  The armpits.  Folds of abdominal skin.  Webbed spaces between the fingers or toes. Intertrigo may be aggravated by:  Sweat.  Feces.  Yeast or bacteria that are present near skin folds.  Urine.  Vaginal discharge. HOME CARE INSTRUCTIONS  The following steps can be taken to reduce friction and keep the affected area cool and dry:  Expose skin folds to the air.  Keep deep skin folds separated with cotton or linen cloth. Avoid tight fitting clothing that could cause chafing.  Wear open-toed shoes or sandals to help reduce moisture between the toes.  Apply absorbent powders to affected areas as directed  by your caregiver.  Apply over-the-counter barrier pastes, such as zinc oxide, as directed by your caregiver.  If you develop a fungal infection in the affected area, your caregiver may have you use antifungal creams. SEEK MEDICAL CARE IF:   The rash is not improving after 1 week of treatment.  The rash is getting worse (more red, more swollen, more painful, or spreading).  You have a fever or chills. MAKE SURE YOU:   Understand these instructions.  Will watch your condition.  Will get help right away if you are not doing well or get worse.   This information is not intended to replace advice given to you by your health care provider. Make sure you discuss any questions you have with your health care provider.   Document Released: 12/22/2004 Document Revised: 03/16/2011 Document Reviewed: 06/25/2014 Elsevier Interactive Patient Education 2016 [place] K. Panosh M.D.

## 2015-05-31 ENCOUNTER — Encounter: Payer: Self-pay | Admitting: Internal Medicine

## 2015-05-31 ENCOUNTER — Ambulatory Visit (INDEPENDENT_AMBULATORY_CARE_PROVIDER_SITE_OTHER): Payer: 59 | Admitting: Internal Medicine

## 2015-05-31 VITALS — BP 108/80 | HR 68 | Temp 98.1°F | Ht 62.0 in | Wt 138.4 lb

## 2015-05-31 DIAGNOSIS — L304 Erythema intertrigo: Secondary | ICD-10-CM | POA: Diagnosis not present

## 2015-05-31 DIAGNOSIS — Z0000 Encounter for general adult medical examination without abnormal findings: Secondary | ICD-10-CM | POA: Diagnosis not present

## 2015-05-31 DIAGNOSIS — R7309 Other abnormal glucose: Secondary | ICD-10-CM | POA: Diagnosis not present

## 2015-05-31 DIAGNOSIS — E786 Lipoprotein deficiency: Secondary | ICD-10-CM | POA: Diagnosis not present

## 2015-05-31 LAB — POCT GLYCOSYLATED HEMOGLOBIN (HGB A1C): Hemoglobin A1C: 6.1

## 2015-05-31 NOTE — Progress Notes (Signed)
Pre visit review using our clinic tool,if applicable. No additional management support is needed unless otherwise documented below in the visit note.  

## 2015-05-31 NOTE — Patient Instructions (Addendum)
Keep area under breast dry   Use OTC miconazole or  clotrimazole twice a day for up to 2 weeks   In addition can add 1% hydrocortisone   2-3 x per day if needed for itching .  Keeping dry should help it heal .  Intensify lifestyle interventions. As we discussed  Pick up exrecise   Decrease simple carbohydrates such as rice .  important to control sugar to not develop diabetes.  Can  Consider adding  Metformin if sugar not controlled.   Keep BP in normal range is also healthy for your    Get your mammogram    Health Maintenance, Female Adopting a healthy lifestyle and getting preventive care can go a long way to promote health and wellness. Talk with your health care provider about what schedule of regular examinations is right for you. This is a good chance for you to check in with your provider about disease prevention and staying healthy. In between checkups, there are plenty of things you can do on your own. Experts have done a lot of research about which lifestyle changes and preventive measures are most likely to keep you healthy. Ask your health care provider for more information. WEIGHT AND DIET  Eat a healthy diet  Be sure to include plenty of vegetables, fruits, low-fat dairy products, and lean protein.  Do not eat a lot of foods high in solid fats, added sugars, or salt.  Get regular exercise. This is one of the most important things you can do for your health.  Most adults should exercise for at least 150 minutes each week. The exercise should increase your heart rate and make you sweat (moderate-intensity exercise).  Most adults should also do strengthening exercises at least twice a week. This is in addition to the moderate-intensity exercise.  Maintain a healthy weight  Body mass index (BMI) is a measurement that can be used to identify possible weight problems. It estimates body fat based on height and weight. Your health care provider can help determine your BMI  and help you achieve or maintain a healthy weight.  For females 53 years of age and older:   A BMI below 18.5 is considered underweight.  A BMI of 18.5 to 24.9 is normal.  A BMI of 25 to 29.9 is considered overweight.  A BMI of 30 and above is considered obese.  Watch levels of cholesterol and blood lipids  You should start having your blood tested for lipids and cholesterol at 46 years of age, then have this test every 5 years.  You may need to have your cholesterol levels checked more often if:  Your lipid or cholesterol levels are high.  You are older than 46 years of age.  You are at high risk for heart disease.  CANCER SCREENING   Lung Cancer  Lung cancer screening is recommended for adults 62-71 years old who are at high risk for lung cancer because of a history of smoking.  A yearly low-dose CT scan of the lungs is recommended for people who:  Currently smoke.  Have quit within the past 15 years.  Have at least a 30-pack-year history of smoking. A pack year is smoking an average of one pack of cigarettes a day for 1 year.  Yearly screening should continue until it has been 15 years since you quit.  Yearly screening should stop if you develop a health problem that would prevent you from having lung cancer treatment.  Breast Cancer  Practice breast self-awareness. This means understanding how your breasts normally appear and feel.  It also means doing regular breast self-exams. Let your health care provider know about any changes, no matter how small.  If you are in your 20s or 30s, you should have a clinical breast exam (CBE) by a health care provider every 1-3 years as part of a regular health exam.  If you are 25 or older, have a CBE every year. Also consider having a breast X-ray (mammogram) every year.  If you have a family history of breast cancer, talk to your health care provider about genetic screening.  If you are at high risk for breast cancer,  talk to your health care provider about having an MRI and a mammogram every year.  Breast cancer gene (BRCA) assessment is recommended for women who have family members with BRCA-related cancers. BRCA-related cancers include:  Breast.  Ovarian.  Tubal.  Peritoneal cancers.  Results of the assessment will determine the need for genetic counseling and BRCA1 and BRCA2 testing. Cervical Cancer Your health care provider may recommend that you be screened regularly for cancer of the pelvic organs (ovaries, uterus, and vagina). This screening involves a pelvic examination, including checking for microscopic changes to the surface of your cervix (Pap test). You may be encouraged to have this screening done every 3 years, beginning at age 70.  For women ages 7-65, health care providers may recommend pelvic exams and Pap testing every 3 years, or they may recommend the Pap and pelvic exam, combined with testing for human papilloma virus (HPV), every 5 years. Some types of HPV increase your risk of cervical cancer. Testing for HPV may also be done on women of any age with unclear Pap test results.  Other health care providers may not recommend any screening for nonpregnant women who are considered low risk for pelvic cancer and who do not have symptoms. Ask your health care provider if a screening pelvic exam is right for you.  If you have had past treatment for cervical cancer or a condition that could lead to cancer, you need Pap tests and screening for cancer for at least 20 years after your treatment. If Pap tests have been discontinued, your risk factors (such as having a new sexual partner) need to be reassessed to determine if screening should resume. Some women have medical problems that increase the chance of getting cervical cancer. In these cases, your health care provider may recommend more frequent screening and Pap tests. Colorectal Cancer  This type of cancer can be detected and often  prevented.  Routine colorectal cancer screening usually begins at 46 years of age and continues through 46 years of age.  Your health care provider may recommend screening at an earlier age if you have risk factors for colon cancer.  Your health care provider may also recommend using home test kits to check for hidden blood in the stool.  A small camera at the end of a tube can be used to examine your colon directly (sigmoidoscopy or colonoscopy). This is done to check for the earliest forms of colorectal cancer.  Routine screening usually begins at age 48.  Direct examination of the colon should be repeated every 5-10 years through 46 years of age. However, you may need to be screened more often if early forms of precancerous polyps or small growths are found. Skin Cancer  Check your skin from head to toe regularly.  Tell your health care provider about any  new moles or changes in moles, especially if there is a change in a mole's shape or color.  Also tell your health care provider if you have a mole that is larger than the size of a pencil eraser.  Always use sunscreen. Apply sunscreen liberally and repeatedly throughout the day.  Protect yourself by wearing long sleeves, pants, a wide-brimmed hat, and sunglasses whenever you are outside. HEART DISEASE, DIABETES, AND HIGH BLOOD PRESSURE   High blood pressure causes heart disease and increases the risk of stroke. High blood pressure is more likely to develop in:  People who have blood pressure in the high end of the normal range (130-139/85-89 mm Hg).  People who are overweight or obese.  People who are African American.  If you are 26-72 years of age, have your blood pressure checked every 3-5 years. If you are 50 years of age or older, have your blood pressure checked every year. You should have your blood pressure measured twice--once when you are at a hospital or clinic, and once when you are not at a hospital or clinic.  Record the average of the two measurements. To check your blood pressure when you are not at a hospital or clinic, you can use:  An automated blood pressure machine at a pharmacy.  A home blood pressure monitor.  If you are between 80 years and 69 years old, ask your health care provider if you should take aspirin to prevent strokes.  Have regular diabetes screenings. This involves taking a blood sample to check your fasting blood sugar level.  If you are at a normal weight and have a low risk for diabetes, have this test once every three years after 46 years of age.  If you are overweight and have a high risk for diabetes, consider being tested at a younger age or more often. PREVENTING INFECTION  Hepatitis B  If you have a higher risk for hepatitis B, you should be screened for this virus. You are considered at high risk for hepatitis B if:  You were born in a country where hepatitis B is common. Ask your health care provider which countries are considered high risk.  Your parents were born in a high-risk country, and you have not been immunized against hepatitis B (hepatitis B vaccine).  You have HIV or AIDS.  You use needles to inject street drugs.  You live with someone who has hepatitis B.  You have had sex with someone who has hepatitis B.  You get hemodialysis treatment.  You take certain medicines for conditions, including cancer, organ transplantation, and autoimmune conditions. Hepatitis C  Blood testing is recommended for:  Everyone born from [birth year] through 1966.  Anyone with known risk factors for hepatitis C. Sexually transmitted infections (STIs)  You should be screened for sexually transmitted infections (STIs) including gonorrhea and chlamydia if:  You are sexually active and are younger than 46 years of age.  You are older than 46 years of age and your health care provider tells you that you are at risk for this type of infection.  Your sexual activity  has changed since you were last screened and you are at an increased risk for chlamydia or gonorrhea. Ask your health care provider if you are at risk.  If you do not have HIV, but are at risk, it may be recommended that you take a prescription medicine daily to prevent HIV infection. This is called pre-exposure prophylaxis (PrEP). You are considered at risk  if:  You are sexually active and do not regularly use condoms or know the HIV status of your partner(s).  You take drugs by injection.  You are sexually active with a partner who has HIV. Talk with your health care provider about whether you are at high risk of being infected with HIV. If you choose to begin PrEP, you should first be tested for HIV. You should then be tested every 3 months for as long as you are taking PrEP.  PREGNANCY   If you are premenopausal and you may become pregnant, ask your health care provider about preconception counseling.  If you may become pregnant, take 400 to 800 micrograms (mcg) of folic acid every day.  If you want to prevent pregnancy, talk to your health care provider about birth control (contraception). OSTEOPOROSIS AND MENOPAUSE   Osteoporosis is a disease in which the bones lose minerals and strength with aging. This can result in serious bone fractures. Your risk for osteoporosis can be identified using a bone density scan.  If you are 22 years of age or older, or if you are at risk for osteoporosis and fractures, ask your health care provider if you should be screened.  Ask your health care provider whether you should take a calcium or vitamin D supplement to lower your risk for osteoporosis.  Menopause may have certain physical symptoms and risks.  Hormone replacement therapy may reduce some of these symptoms and risks. Talk to your health care provider about whether hormone replacement therapy is right for you.  HOME CARE INSTRUCTIONS   Schedule regular health, dental, and eye  exams.  Stay current with your immunizations.   Do not use any tobacco products including cigarettes, chewing tobacco, or electronic cigarettes.  If you are pregnant, do not drink alcohol.  If you are breastfeeding, limit how much and how often you drink alcohol.  Limit alcohol intake to no more than 1 drink per day for nonpregnant women. One drink equals 12 ounces of beer, 5 ounces of wine, or 1 ounces of hard liquor.  Do not use street drugs.  Do not share needles.  Ask your health care provider for help if you need support or information about quitting drugs.  Tell your health care provider if you often feel depressed.  Tell your health care provider if you have ever been abused or do not feel safe at home.   This information is not intended to replace advice given to you by your health care provider. Make sure you discuss any questions you have with your health care provider.   Document Released: 07/07/2010 Document Revised: 01/12/2014 Document Reviewed: 11/23/2012 Elsevier Interactive Patient Education [employer].    Why follow it? Research shows. . Those who follow the Mediterranean diet have a reduced risk of heart disease  . The diet is associated with a reduced incidence of Parkinson's and Alzheimer's diseases . People following the diet may have longer life expectancies and lower rates of chronic diseases  . The Dietary Guidelines for Americans recommends the Mediterranean diet as an eating plan to promote health and prevent disease  What Is the Mediterranean Diet?  . Healthy eating plan based on typical foods and recipes of Mediterranean-style cooking . The diet is primarily a plant based diet; these foods should make up a majority of meals   Starches - Plant based foods should make up a majority of meals - They are an important sources of vitamins, minerals, energy, antioxidants, and  fiber - Choose whole grains, foods high in fiber and minimally processed  items  - Typical grain sources include wheat, oats, barley, corn, brown rice, bulgar, farro, millet, polenta, couscous  - Various types of beans include chickpeas, lentils, fava beans, black beans, white beans   Fruits  Veggies - Large quantities of antioxidant rich fruits & veggies; 6 or more servings  - Vegetables can be eaten raw or lightly drizzled with oil and cooked  - Vegetables common to the traditional Mediterranean Diet include: artichokes, arugula, beets, broccoli, brussel sprouts, cabbage, carrots, celery, collard greens, cucumbers, eggplant, kale, leeks, lemons, lettuce, mushrooms, okra, onions, peas, peppers, potatoes, pumpkin, radishes, rutabaga, shallots, spinach, sweet potatoes, turnips, zucchini - Fruits common to the Mediterranean Diet include: apples, apricots, avocados, cherries, clementines, dates, figs, grapefruits, grapes, melons, nectarines, oranges, peaches, pears, pomegranates, strawberries, tangerines  Fats - Replace butter and margarine with healthy oils, such as olive oil, canola oil, and tahini  - Limit nuts to no more than a handful a day  - Nuts include walnuts, almonds, pecans, pistachios, pine nuts  - Limit or avoid candied, honey roasted or heavily salted nuts - Olives are central to the [employer] - can be eaten whole or used in a variety of dishes   Meats Protein - Limiting red meat: no more than a few times a month - When eating red meat: choose lean cuts and keep the portion to the size of deck of cards - Eggs: approx. 0 to 4 times a week  - Fish and lean poultry: at least 2 a week  - Healthy protein sources include, chicken, [place], lean beef, lamb - Increase intake of seafood such as tuna, salmon, trout, mackerel, shrimp, scallops - Avoid or limit high fat processed meats such as sausage and bacon  Dairy - Include moderate amounts of low fat dairy products  - Focus on healthy dairy such as fat free yogurt, skim milk, low or reduced fat cheese -  Limit dairy products higher in fat such as whole or 2% milk, cheese, ice cream  Alcohol - Moderate amounts of red wine is ok  - No more than 5 oz daily for women (all ages) and men older than age 28  - No more than 10 oz of wine daily for men younger than 67  Other - Limit sweets and other desserts  - Use herbs and spices instead of salt to flavor foods  - Herbs and spices common to the traditional Mediterranean Diet include: basil, bay leaves, chives, cloves, cumin, fennel, garlic, lavender, marjoram, mint, oregano, parsley, pepper, rosemary, sage, savory, sumac, tarragon, thyme   It's not just a diet, it's a lifestyle:  . The Mediterranean diet includes lifestyle factors typical of those in the region  . Foods, drinks and meals are best eaten with others and savored . Daily physical activity is important for overall good health . This could be strenuous exercise like running and aerobics . This could also be more leisurely activities such as walking, housework, yard-work, or taking the stairs . Moderation is the key; a balanced and healthy diet accommodates most foods and drinks . Consider portion sizes and frequency of consumption of certain foods   Meal Ideas & Options:  . Breakfast:  o Whole wheat toast or whole wheat English muffins with peanut butter & hard boiled egg o Steel cut oats topped with apples & cinnamon and skim milk  o Fresh fruit: banana, strawberries, melon, berries, peaches  o Smoothies:  strawberries, bananas, greek yogurt, peanut butter o Low fat greek yogurt with blueberries and granola  o Egg white omelet with spinach and mushrooms o Breakfast couscous: whole wheat couscous, apricots, skim milk, cranberries  . Sandwiches:  o Hummus and grilled vegetables (peppers, zucchini, squash) on whole wheat bread   o Grilled chicken on whole wheat pita with lettuce, tomatoes, cucumbers or tzatziki  o Tuna salad on whole wheat bread: tuna salad made with greek yogurt,  olives, red peppers, capers, green onions o Garlic rosemary lamb pita: lamb sauted with garlic, rosemary, salt & pepper; add lettuce, cucumber, greek yogurt to pita - flavor with lemon juice and black pepper  . Seafood:  o Mediterranean grilled salmon, seasoned with garlic, basil, parsley, lemon juice and black pepper o Shrimp, lemon, and spinach whole-grain pasta salad made with low fat greek yogurt  o Seared scallops with lemon orzo  o Seared tuna steaks seasoned salt, pepper, coriander topped with tomato mixture of olives, tomatoes, olive oil, minced garlic, parsley, green onions and cappers  . Meats:  o Herbed greek chicken salad with kalamata olives, cucumber, feta  o Red bell peppers stuffed with spinach, bulgur, lean ground beef (or lentils) & topped with feta   o Kebabs: skewers of chicken, tomatoes, onions, zucchini, squash  o [place] burgers: made with red onions, mint, dill, lemon juice, feta cheese topped with roasted red peppers . Vegetarian o Cucumber salad: cucumbers, artichoke hearts, celery, red onion, feta cheese, tossed in olive oil & lemon juice  o Hummus and whole grain pita points with a greek salad (lettuce, tomato, feta, olives, cucumbers, red onion) o Lentil soup with celery, carrots made with vegetable broth, garlic, salt and pepper  o Tabouli salad: parsley, bulgur, mint, scallions, cucumbers, tomato, radishes, lemon juice, olive oil, salt and pepper.       Intertrigo Intertrigo is a skin condition that occurs in between folds of skin in places on the body that rub together a lot and do not get much ventilation. It is caused by heat, moisture, friction, sweat retention, and lack of air circulation, which produces red, irritated patches and, sometimes, scaling or drainage. People who have diabetes, who are obese, or who have treatment with antibiotics are at increased risk for intertrigo. The most common sites for intertrigo to occur include:  The groin.  The  breasts.  The armpits.  Folds of abdominal skin.  Webbed spaces between the fingers or toes. Intertrigo may be aggravated by:  Sweat.  Feces.  Yeast or bacteria that are present near skin folds.  Urine.  Vaginal discharge. HOME CARE INSTRUCTIONS  The following steps can be taken to reduce friction and keep the affected area cool and dry:  Expose skin folds to the air.  Keep deep skin folds separated with cotton or linen cloth. Avoid tight fitting clothing that could cause chafing.  Wear open-toed shoes or sandals to help reduce moisture between the toes.  Apply absorbent powders to affected areas as directed by your caregiver.  Apply over-the-counter barrier pastes, such as zinc oxide, as directed by your caregiver.  If you develop a fungal infection in the affected area, your caregiver may have you use antifungal creams. SEEK MEDICAL CARE IF:   The rash is not improving after 1 week of treatment.  The rash is getting worse (more red, more swollen, more painful, or spreading).  You have a fever or chills. MAKE SURE YOU:   Understand these instructions.  Will watch your condition.  Will get help right away if you are not doing well or get worse.   This information is not intended to replace advice given to you by your health care provider. Make sure you discuss any questions you have with your health care provider.   Document Released: 12/22/2004 Document Revised: 03/16/2011 Document Reviewed: 06/25/2014 Elsevier Interactive Patient Education [employer].

## 2015-06-11 ENCOUNTER — Telehealth: Payer: Self-pay | Admitting: Internal Medicine

## 2015-06-11 NOTE — Telephone Encounter (Signed)
Dr. Sarajane Jews, can you look at this for pt due to Dr. Regis Bill out of the office. Thanks

## 2015-06-11 NOTE — Telephone Encounter (Signed)
Pt was seen on 5-27 and was told to get otc cream. Pt said cream is not working and would like rx send to [facility] on [address]. Pt is aware md out of office this week

## 2015-06-11 NOTE — Telephone Encounter (Signed)
Call in Ketoconazole 2% cream to apply bid prn, 30 grams, no rf

## 2015-06-12 MED ORDER — KETOCONAZOLE 2 % EX CREA: 1.0000 "application " | TOPICAL_CREAM | Freq: Two times a day (BID) | CUTANEOUS | Status: DC

## 2015-06-12 NOTE — Telephone Encounter (Signed)
Left detailed message on personal voicemail Rx sent to pharmacy. Any questions please call office.

## 2016-09-08 NOTE — Progress Notes (Signed)
No chief complaint on file.   HPI: Patient  Janet Nichols  46 y.o. comes in today for Preventive Health Care visit  Periods about every other month since January  Last June and then march .  6 days  Had essure dr Ronita Hipps    Health Maintenance  Topic Date Due  . HIV Screening  07/23/1984  . MAMMOGRAM  04/06/2014  . INFLUENZA VACCINE  08/05/2016  . PAP SMEAR  09/05/2016  . TETANUS/TDAP  07/18/2017   Health Maintenance Review LIFESTYLE:  Exercise:  Walk on treadmill 3-4 per week  1 hours  Tobacco/ETS: no Alcohol:  no Sugar beverages: no Sleep: about 6-7  Drug use: no HH of  3 +  Work:  12 hours    Alt 3- 4 day s.   ROS:  Had ic  Takes toviz? With help  GEN/ HEENT: No fever, significant weight changes sweats headaches vision problems hearing changes, CV/ PULM; No chest pain shortness of breath cough, syncope,edema  change in exercise tolerance. GI /GU: No adominal pain, vomiting, change in bowel habits. No blood in the stool. No significant GU symptoms. SKIN/HEME: ,no acute skin rashes suspicious lesions or bleeding. No lymphadenopathy, nodules, masses.  NEURO/ PSYCH:  No neurologic signs such as weakness numbness. No depression anxiety. IMM/ Allergy: No unusual infections.  Allergy .   REST of 12 system review negative except as per HPI   Past Medical History:  Diagnosis Date  . Acne   . Gestational diabetes   . IC (interstitial cystitis)   . Low HDL (under 40)   . Rosacea     Past Surgical History:  Procedure Laterality Date  . bladder stretched    . ESSURE TUBAL LIGATION  2011    Family History  Problem Relation Age of Onset  . Hypertension Mother   . Diabetes Mother   . Hypertension Sister   . Breast cancer Cousin   . Kidney failure Mother        mom on dialysis has dn    Social History   Social History  . Marital status: Married    Spouse name: N/A  . Number of children: N/A  . Years of education: N/A   Social History Main Topics  . Smoking  status: Never Smoker  . Smokeless tobacco: Never Used  . Alcohol use No  . Drug use: No  . Sexual activity: Not Asked   Other Topics Concern  . None   Social History Narrative   Married   HH of 4       No pets   Sleep 7 hours   40 hours at least 10 hours days mon th friday   Exercise sometimes             Outpatient Medications Prior to Visit  Medication Sig Dispense Refill  . ketoconazole (NIZORAL) 2 % cream Apply 1 application topically 2 (two) times daily. (Patient not taking: Reported on 09/09/2016) 30 g 0  . metroNIDAZOLE (METROGEL) 0.75 % gel Reported on 12/19/2014    . Mirabegron (MYRBETRIQ PO) Take by mouth. Reported on 05/31/2015     No facility-administered medications prior to visit.      EXAM:  BP 116/70 (BP Location: Right Arm, Patient Position: Sitting, Cuff Size: Normal)   Pulse 61   Temp 98.3 F (36.8 C) (Oral)   Ht '5\' 2"'$  (1.575 m)   Wt 138 lb 3.2 oz (62.7 kg)   LMP 05/05/2016   SpO2 98%  BMI 25.28 kg/m   Body mass index is 25.28 kg/m. Wt Readings from Last 3 Encounters:  09/09/16 138 lb 3.2 oz (62.7 kg)  05/31/15 138 lb 6.4 oz (62.8 kg)  12/19/14 142 lb (64.4 kg)    Physical Exam: Vital signs reviewed [id number] is a well-developed well-nourished alert cooperative    who appearsr stated age in no acute distress.  HEENT: normocephalic atraumatic , Eyes: PERRL EOM's full, conjunctiva clear, Nares: paten,t no deformity discharge or tenderness., Ears: no deformity EAC's clear TMs with normal landmarks. Mouth: clear OP, no lesions, edema.  Moist mucous membranes. Dentition in adequate repair. NECK: supple without masses, thyromegaly or bruits. CHEST/PULM:  Clear to auscultation and percussion breath sounds equal no wheeze , rales or rhonchi. No chest wall deformities or tenderness. Breast: normal by inspection . No dimpling, discharge, masses, tenderness or discharge . CV: PMI is nondisplaced, S1 S2 no gallops, murmurs, rubs. Peripheral pulses are  full without delay.No JVD .  ABDOMEN: Bowel sounds normal nontender  No guard or rebound, no hepato splenomegal no CVA tenderness.  No hernia. Extremtities:  No clubbing cyanosis or edema, no acute joint swelling or redness no focal atrophy NEURO:  Oriented x3, cranial nerves 3-12 appear to be intact, no obvious focal weakness,gait within normal limits no abnormal reflexes or asymmetrical SKIN: No acute rashes normal turgor, color, no bruising or petechiae. Pelvic: NL ext GU, labia clear without lesions or rash . Vagina no lesions .Cervix: clear 1 + ectopy   UTERUS: Neg  Some to right  ? uln  CMT Adnexa:  clear no masses . PAP done w hi risk HPV PSYCH: Oriented, good eye contact, no obvious depression anxiety, cognition and judgment appear normal. LN: no cervical axillary inguinal adenopathy  Lab Results  Component Value Date   WBC 6.5 05/27/2015   HGB 13.3 05/27/2015   HCT 39.0 05/27/2015   PLT 228.0 05/27/2015   GLUCOSE 109 (H) 05/27/2015   CHOL 136 05/27/2015   TRIG 95.0 05/27/2015   HDL 31.50 (L) 05/27/2015   LDLCALC 85 05/27/2015   ALT 29 05/27/2015   AST 24 05/27/2015   NA 138 05/27/2015   K 3.7 05/27/2015   CL 108 05/27/2015   CREATININE 0.67 05/27/2015   BUN 11 05/27/2015   CO2 25 05/27/2015   TSH 0.53 05/27/2015   HGBA1C 6.1 05/31/2015    BP Readings from Last 3 Encounters:  09/09/16 116/70  05/31/15 108/80  12/19/14 117/81   Wt Readings from Last 3 Encounters:  09/09/16 138 lb 3.2 oz (62.7 kg)  05/31/15 138 lb 6.4 oz (62.8 kg)  12/19/14 142 lb (64.4 kg)    Lab results reviewed with patient   ASSESSMENT AND PLAN:  Discussed the following assessment and plan:  Visit for preventive health examination - Plan: Hemoglobin A1c, Lipid panel, Comprehensive metabolic panel, CBC with Differential/Platelet, Cytology - PAP [place]  Low HDL (under 40) - Plan: Hemoglobin A1c, Lipid panel, Comprehensive metabolic panel, CBC with Differential/Platelet  HYPERGLYCEMIA  - Plan: Hemoglobin A1c, Lipid panel, Comprehensive metabolic panel, CBC with Differential/Platelet  History of gestational diabetes - Plan: Hemoglobin A1c, Lipid panel, Comprehensive metabolic panel, CBC with Differential/Platelet  Encounter for gynecological examination without abnormal finding  Menstrual periods irregular - Plan: POCT urine pregnancy, Follicle Stimulating Hormone  Need for prophylactic vaccination and inoculation against influenza - Plan: Flu Vaccine QUAD 36+ mos IM (Fluarix & Fluzone Quad PF  Patient Care Team: Burnis Medin, MD as PCP - Hubbard Robinson,  Sharyon Cable, MD as Referring Physician (Urology) Garald Balding, MD (Orthopedic Surgery) Brien Few, MD as Attending Physician (Obstetrics and Gynecology) dermatologist  Patient Instructions  Will notify you  of labs when available. And PAP with hi risk HPV   If all ok then see  you in a year. Get mammogram . Can get flu vaccine  At work .      Preventive Care 40-64 Years, Female Preventive care refers to lifestyle choices and visits with your health care provider that can promote health and wellness. What does preventive care include?  A yearly physical exam. This is also called an annual well check.  Dental exams once or twice a year.  Routine eye exams. Ask your health care provider how often you should have your eyes checked.  Personal lifestyle choices, including: ? Daily care of your teeth and gums. ? Regular physical activity. ? Eating a healthy diet. ? Avoiding tobacco and drug use. ? Limiting alcohol use. ? Practicing safe sex. ? Taking low-dose aspirin daily starting at age 88. ? Taking vitamin and mineral supplements as recommended by your health care provider. What happens during an annual well check? The services and screenings done by your health care provider during your annual well check will depend on your age, overall health, lifestyle risk factors, and family history of  disease. Counseling Your health care provider may ask you questions about your:  Alcohol use.  Tobacco use.  Drug use.  Emotional well-being.  Home and relationship well-being.  Sexual activity.  Eating habits.  Work and work Statistician.  Method of birth control.  Menstrual cycle.  Pregnancy history.  Screening You may have the following tests or measurements:  Height, weight, and BMI.  Blood pressure.  Lipid and cholesterol levels. These may be checked every 5 years, or more frequently if you are over 47 years old.  Skin check.  Lung cancer screening. You may have this screening every year starting at age 77 if you have a 30-pack-year history of smoking and currently smoke or have quit within the past 15 years.  Fecal occult blood test (FOBT) of the stool. You may have this test every year starting at age 93.  Flexible sigmoidoscopy or colonoscopy. You may have a sigmoidoscopy every 5 years or a colonoscopy every 10 years starting at age 74.  Hepatitis C blood test.  Hepatitis B blood test.  Sexually transmitted disease (STD) testing.  Diabetes screening. This is done by checking your blood sugar (glucose) after you have not eaten for a while (fasting). You may have this done every 1-3 years.  Mammogram. This may be done every 1-2 years. Talk to your health care provider about when you should start having regular mammograms. This may depend on whether you have a family history of breast cancer.  BRCA-related cancer screening. This may be done if you have a family history of breast, ovarian, tubal, or peritoneal cancers.  Pelvic exam and Pap test. This may be done every 3 years starting at age 59. Starting at age 48, this may be done every 5 years if you have a Pap test in combination with an HPV test.  Bone density scan. This is done to screen for osteoporosis. You may have this scan if you are at high risk for osteoporosis.  Discuss your test results,  treatment options, and if necessary, the need for more tests with your health care provider. Vaccines Your health care provider may recommend certain vaccines, such as:  Influenza vaccine. This is recommended every year.  Tetanus, diphtheria, and acellular pertussis (Tdap, Td) vaccine. You may need a Td booster every 10 years.  Varicella vaccine. You may need this if you have not been vaccinated.  Zoster vaccine. You may need this after age 30.  Measles, mumps, and rubella (MMR) vaccine. You may need at least one dose of MMR if you were born in 1957 or later. You may also need a second dose.  Pneumococcal 13-valent conjugate (PCV13) vaccine. You may need this if you have certain conditions and were not previously vaccinated.  Pneumococcal polysaccharide (PPSV23) vaccine. You may need one or two doses if you smoke cigarettes or if you have certain conditions.  Meningococcal vaccine. You may need this if you have certain conditions.  Hepatitis A vaccine. You may need this if you have certain conditions or if you travel or work in places where you may be exposed to hepatitis A.  Hepatitis B vaccine. You may need this if you have certain conditions or if you travel or work in places where you may be exposed to hepatitis B.  Haemophilus influenzae type b (Hib) vaccine. You may need this if you have certain conditions.  Talk to your health care provider about which screenings and vaccines you need and how often you need them. This information is not intended to replace advice given to you by your health care provider. Make sure you discuss any questions you have with your health care provider. Document Released: 01/18/2015 Document Revised: 09/11/2015 Document Reviewed: 10/23/2014 Elsevier Interactive Patient Education  2017 [place] [employer]. Cowen Pesqueira M.D.

## 2016-09-09 ENCOUNTER — Other Ambulatory Visit (HOSPITAL_COMMUNITY)
Admission: RE | Admit: 2016-09-09 | Discharge: 2016-09-09 | Disposition: A | Discharge status: Home / Self Care | Payer: 59 | Source: Ambulatory Visit | Attending: Internal Medicine | Admitting: Internal Medicine

## 2016-09-09 ENCOUNTER — Ambulatory Visit (INDEPENDENT_AMBULATORY_CARE_PROVIDER_SITE_OTHER): Payer: 59 | Admitting: Internal Medicine

## 2016-09-09 ENCOUNTER — Encounter: Payer: Self-pay | Admitting: Internal Medicine

## 2016-09-09 VITALS — BP 116/70 | HR 61 | Temp 98.3°F | Ht 62.0 in | Wt 138.2 lb

## 2016-09-09 DIAGNOSIS — E786 Lipoprotein deficiency: Secondary | ICD-10-CM | POA: Insufficient documentation

## 2016-09-09 DIAGNOSIS — N926 Irregular menstruation, unspecified: Secondary | ICD-10-CM | POA: Diagnosis not present

## 2016-09-09 DIAGNOSIS — Z8632 Personal history of gestational diabetes: Secondary | ICD-10-CM

## 2016-09-09 DIAGNOSIS — Z23 Encounter for immunization: Secondary | ICD-10-CM | POA: Diagnosis not present

## 2016-09-09 DIAGNOSIS — Z01419 Encounter for gynecological examination (general) (routine) without abnormal findings: Secondary | ICD-10-CM

## 2016-09-09 DIAGNOSIS — R7309 Other abnormal glucose: Secondary | ICD-10-CM

## 2016-09-09 DIAGNOSIS — Z0000 Encounter for general adult medical examination without abnormal findings: Secondary | ICD-10-CM | POA: Insufficient documentation

## 2016-09-09 LAB — COMPREHENSIVE METABOLIC PANEL
ALK PHOS: 55 U/L (ref 39–117)
ALT: 15 U/L (ref 0–35)
AST: 19 U/L (ref 0–37)
Albumin: 4.3 g/dL (ref 3.5–5.2)
BUN: 10 mg/dL (ref 6–23)
CHLORIDE: 105 meq/L (ref 96–112)
CO2: 29 mEq/L (ref 19–32)
Calcium: 9.4 mg/dL (ref 8.4–10.5)
Creatinine, Ser: 0.61 mg/dL (ref 0.40–1.20)
GFR: 111.68 mL/min (ref >60.00)
GLUCOSE: 97 mg/dL (ref 70–99)
POTASSIUM: 4.4 meq/L (ref 3.5–5.1)
Sodium: 140 mEq/L (ref 135–145)
TOTAL PROTEIN: 7.3 g/dL (ref 6.0–8.3)
Total Bilirubin: 0.7 mg/dL (ref 0.2–1.2)

## 2016-09-09 LAB — CBC WITH DIFFERENTIAL/PLATELET
BASOS ABS: 0 10*3/uL (ref 0.0–0.1)
Basophils Relative: 0.7 % (ref 0.0–3.0)
EOS ABS: 0.2 10*3/uL (ref 0.0–0.7)
Eosinophils Relative: 2.3 % (ref 0.0–5.0)
HCT: 42.2 % (ref 36.0–46.0)
Hemoglobin: 14.2 g/dL (ref 12.0–15.0)
Lymphocytes Relative: 29.6 % (ref 12.0–46.0)
Lymphs Abs: 2.1 10*3/uL (ref 0.7–4.0)
MCHC: 33.7 g/dL (ref 30.0–36.0)
MCV: 93 fl (ref 78.0–100.0)
MONO ABS: 0.8 10*3/uL (ref 0.1–1.0)
MONOS PCT: 10.8 % (ref 3.0–12.0)
NEUTROS PCT: 56.6 % (ref 43.0–77.0)
Neutro Abs: 4 10*3/uL (ref 1.4–7.7)
Platelets: 218 10*3/uL (ref 150.0–400.0)
RBC: 4.54 Mil/uL (ref 3.87–5.11)
RDW: 13 % (ref 11.5–15.5)
WBC: 7 10*3/uL (ref 4.0–10.5)

## 2016-09-09 LAB — LIPID PANEL
CHOL/HDL RATIO: 4
Cholesterol: 156 mg/dL (ref 0–200)
HDL: 35.9 mg/dL — AB (ref >39.00)
LDL CALC: 90 mg/dL (ref 0–99)
NONHDL: 119.86
Triglycerides: 149 mg/dL (ref 0.0–149.0)
VLDL: 29.8 mg/dL (ref 0.0–40.0)

## 2016-09-09 LAB — POCT URINE PREGNANCY: PREG TEST UR: NEGATIVE

## 2016-09-09 LAB — HEMOGLOBIN A1C: Hgb A1c MFr Bld: 5.9 % (ref 4.6–6.5)

## 2016-09-09 LAB — FOLLICLE STIMULATING HORMONE: FSH: 25.2 m[IU]/mL

## 2016-09-09 NOTE — Patient Instructions (Addendum)
Will notify you  of labs when available. And PAP with hi risk HPV   If all ok then see  you in a year. Get mammogram . Can get flu vaccine  At work .      Preventive Care 40-64 Years, Female Preventive care refers to lifestyle choices and visits with your health care provider that can promote health and wellness. What does preventive care include?  A yearly physical exam. This is also called an annual well check.  Dental exams once or twice a year.  Routine eye exams. Ask your health care provider how often you should have your eyes checked.  Personal lifestyle choices, including: ? Daily care of your teeth and gums. ? Regular physical activity. ? Eating a healthy diet. ? Avoiding tobacco and drug use. ? Limiting alcohol use. ? Practicing safe sex. ? Taking low-dose aspirin daily starting at age 13. ? Taking vitamin and mineral supplements as recommended by your health care provider. What happens during an annual well check? The services and screenings done by your health care provider during your annual well check will depend on your age, overall health, lifestyle risk factors, and family history of disease. Counseling Your health care provider may ask you questions about your:  Alcohol use.  Tobacco use.  Drug use.  Emotional well-being.  Home and relationship well-being.  Sexual activity.  Eating habits.  Work and work Statistician.  Method of birth control.  Menstrual cycle.  Pregnancy history.  Screening You may have the following tests or measurements:  Height, weight, and BMI.  Blood pressure.  Lipid and cholesterol levels. These may be checked every 5 years, or more frequently if you are over 59 years old.  Skin check.  Lung cancer screening. You may have this screening every year starting at age 44 if you have a 30-pack-year history of smoking and currently smoke or have quit within the past 15 years.  Fecal occult blood test (FOBT) of the  stool. You may have this test every year starting at age 19.  Flexible sigmoidoscopy or colonoscopy. You may have a sigmoidoscopy every 5 years or a colonoscopy every 10 years starting at age 46.  Hepatitis C blood test.  Hepatitis B blood test.  Sexually transmitted disease (STD) testing.  Diabetes screening. This is done by checking your blood sugar (glucose) after you have not eaten for a while (fasting). You may have this done every 1-3 years.  Mammogram. This may be done every 1-2 years. Talk to your health care provider about when you should start having regular mammograms. This may depend on whether you have a family history of breast cancer.  BRCA-related cancer screening. This may be done if you have a family history of breast, ovarian, tubal, or peritoneal cancers.  Pelvic exam and Pap test. This may be done every 3 years starting at age 20. Starting at age 52, this may be done every 5 years if you have a Pap test in combination with an HPV test.  Bone density scan. This is done to screen for osteoporosis. You may have this scan if you are at high risk for osteoporosis.  Discuss your test results, treatment options, and if necessary, the need for more tests with your health care provider. Vaccines Your health care provider may recommend certain vaccines, such as:  Influenza vaccine. This is recommended every year.  Tetanus, diphtheria, and acellular pertussis (Tdap, Td) vaccine. You may need a Td booster every 10 years.  Varicella  vaccine. You may need this if you have not been vaccinated.  Zoster vaccine. You may need this after age 5.  Measles, mumps, and rubella (MMR) vaccine. You may need at least one dose of MMR if you were born in 1957 or later. You may also need a second dose.  Pneumococcal 13-valent conjugate (PCV13) vaccine. You may need this if you have certain conditions and were not previously vaccinated.  Pneumococcal polysaccharide (PPSV23) vaccine. You  may need one or two doses if you smoke cigarettes or if you have certain conditions.  Meningococcal vaccine. You may need this if you have certain conditions.  Hepatitis A vaccine. You may need this if you have certain conditions or if you travel or work in places where you may be exposed to hepatitis A.  Hepatitis B vaccine. You may need this if you have certain conditions or if you travel or work in places where you may be exposed to hepatitis B.  Haemophilus influenzae type b (Hib) vaccine. You may need this if you have certain conditions.  Talk to your health care provider about which screenings and vaccines you need and how often you need them. This information is not intended to replace advice given to you by your health care provider. Make sure you discuss any questions you have with your health care provider. Document Released: 01/18/2015 Document Revised: 09/11/2015 Document Reviewed: 10/23/2014 Elsevier Interactive Patient Education  2017 [employer].

## 2016-09-10 LAB — CYTOLOGY - PAP
Chlamydia: NEGATIVE
Diagnosis: NEGATIVE
HPV (WINDOPATH): NOT DETECTED
Neisseria Gonorrhea: NEGATIVE
Trichomonas: NEGATIVE

## 2016-09-15 LAB — CERVICOVAGINAL ANCILLARY ONLY: HERPES (WINDOWPATH): NEGATIVE

## 2016-09-16 ENCOUNTER — Other Ambulatory Visit: Payer: Self-pay | Admitting: Emergency Medicine

## 2017-06-02 DIAGNOSIS — L818 Other specified disorders of pigmentation: Secondary | ICD-10-CM | POA: Diagnosis not present

## 2017-06-02 DIAGNOSIS — L718 Other rosacea: Secondary | ICD-10-CM | POA: Diagnosis not present

## 2017-07-06 ENCOUNTER — Ambulatory Visit: Payer: Self-pay | Admitting: Internal Medicine

## 2017-07-06 NOTE — Progress Notes (Signed)
Chief Complaint  Patient presents with  . Leg Pain    numbness in both    HPI: Janet Nichols 47 y.o. come in for acute problem  Sent in my triage from 7 2 for back pain with tingling  For 2 weeks off and on   from the knees down that occur when she gets up in the morning that makes it hard for her to stand in the morning until she gets going.  This is pretty persistent each morning as the day goes on her symptoms subside.  She has had no injury to her back bowel or bladder changes new medicines and no specific weakness in her legs. She has developed some brown rash blotches on both legs bilaterally from about the knees down and is seen her dermatologist placed on a steroid topical with question no diagnosis.  This may have occurred around the time when the symptoms started.  No history of severe back injuries difficulties and is not taking any medicine except perhaps Tylenol for her symptoms. Hurt.  No hx of same.  No change in sleeping arrangements positions etc. ROS: See pertinent positives and negatives per HPI.  Past Medical History:  Diagnosis Date  . Acne   . Gestational diabetes   . IC (interstitial cystitis)   . Low HDL (under 40)   . Rosacea     Family History  Problem Relation Age of Onset  . Hypertension Mother   . Diabetes Mother   . Hypertension Sister   . Breast cancer Cousin   . Kidney failure Mother        mom on dialysis has dn    Social History   Socioeconomic History  . Marital status: Married    Spouse name: Not on file  . Number of children: Not on file  . Years of education: Not on file  . Highest education level: Not on file  Occupational History  . Not on file  Social Needs  . Financial resource strain: Not on file  . Food insecurity:    Worry: Not on file    Inability: Not on file  . Transportation needs:    Medical: Not on file    Non-medical: Not on file  Tobacco Use  . Smoking status: Never Smoker  . Smokeless tobacco: Never Used    Substance and Sexual Activity  . Alcohol use: No    Alcohol/week: 0.0 oz  . Drug use: No  . Sexual activity: Not on file  Lifestyle  . Physical activity:    Days per week: Not on file    Minutes per session: Not on file  . Stress: Not on file  Relationships  . Social connections:    Talks on phone: Not on file    Gets together: Not on file    Attends religious service: Not on file    Active member of club or organization: Not on file    Attends meetings of clubs or organizations: Not on file    Relationship status: Not on file  Other Topics Concern  . Not on file  Social History Narrative   Married   HH of 4       No pets   Sleep 7 hours   40 hours at least 10 hours days mon th friday   Exercise sometimes             Outpatient Medications Prior to Visit  Medication Sig Dispense Refill  . SLHTDS 2  MG TB24 tablet      No facility-administered medications prior to visit.      EXAM:  BP 140/90   Pulse 64   Temp 97.9 F (36.6 C)   Wt 138 lb (62.6 kg)   BMI 25.24 kg/m   Body mass index is 25.24 kg/m.  GENERAL: vitals reviewed and listed above, alert, oriented, appears well hydrated and in no acute distress HEENT: atraumatic, conjunctiva  clear, no obvious abnormalities on inspection of external nose and ears  NECK: no obvious masses on inspection palpation  CV: HRRR, no clubbing cyanosis or  peripheral edema nl cap refill  MS: moves all extremities without noticeable focal  Abnormality there are blotchy brown-tan rash on her lower extremities from the knee down this is symmetrical no blisters inflammation or itching noted.  Feet appear normal. Neurologic gait is within normal limits negative SLR normal DTRs.  She localizes low back when she gets pain question midline.  Toe heel walking is normal she states that the sensation and tingling on her legs is stocking type distribution. PSYCH: pleasant and cooperative, no obvious depression or anxiety  BP Readings  from Last 3 Encounters:  07/07/17 140/90  09/09/16 116/70  05/31/15 108/80    ASSESSMENT AND PLAN:  Discussed the following assessment and plan:  Numbness and tingling of both legs below knees - atypical am numbness and pain in am knees down without bowel vladder or weakness sx . some back pain no weakness get x ray and neuro referral meloxicam fo 1-2 w - Plan: DG Lumbar Spine Complete, Ambulatory referral to Neurology  Low back pain, unspecified back pain laterality, unspecified chronicity, with sciatica presence unspecified - Plan: DG Lumbar Spine Complete  Rash - brown on lower legs rx steroid by derm ? dx  came out near the same time as the numbness. does have rosacea?  Elevated blood pressure reading - get monitor and check bid for 5- 10 days goal 120/80 brind in readings  of contact if remain elevated  Atypical curious presentation for radiculopathy.  First bilateral distal without weakness and worse in the morning. Uncertain if the rash is related. Plan plain back x-ray neurology consult but follow-up with [name] if worsening in the meantime and a trial of Mobic. -Patient advised to return or notify health care team  if  new concerns arise.  Patient Instructions  Please get  Back x ray to r/o surprises.   Could be pinched nerve in your back but atypical to be worse in am  So   Plan neurology referral and not sure if rash related.  You should be contacted    take antiinflammatory once a day for the next 10 - 14 days to see if helps   Inflammation around a nerve .     Standley Brooking. Rodney Yera M.D.  Back x-ray shows some mild degenerative arthritis with grade 1 anterolisthesis

## 2017-07-06 NOTE — Telephone Encounter (Signed)
Pt c/o of lower back pain with bilateral lower leg tingling and numbness that started 2 weeks ago.  Pt stated that she has to stand for about 30 seconds before she begins walking. Pt stated that she is then able to walk and go on about her day. Pt stated that the back pain feels like a sharp pain at her waist line to her hips then to her legs. Pt states the pain is moderate and has been using acetaminophen for the constant pain.  Reason for Disposition . [1] Numbness or tingling on both sides of body AND [2] is a new symptom present > 24 hours    Bilateral lower legs and feet . [1] MODERATE back pain (e.g., interferes with normal activities) AND [2] present > 3 days  Answer Assessment - Initial Assessment Questions 1. SYMPTOM: "What is the main symptom you are concerned about?" (e.g., weakness, numbness)     Numbness and tingling to lower legs to feet 2. ONSET: "When did this start?" (minutes, hours, days; while sleeping)     2 weeks ago 3. LAST NORMAL: "When was the last time you were normal (no symptoms)?"     Before 2 weeks ago 4. PATTERN "Does this come and go, or has it been constant since it started?"  "Is it present now?"     Comes and goes- yes but improves with walking and activity 5. CARDIAC SYMPTOMS: "Have you had any of the following symptoms: chest pain, difficulty breathing, palpitations?"     no 6. NEUROLOGIC SYMPTOMS: "Have you had any of the following symptoms: headache, dizziness, vision loss, double vision, changes in speech, unsteady on your feet?"     Unsteady on feet when first standing up after getting out of bed 7. OTHER SYMPTOMS: "Do you have any other symptoms?"     Sharp pain below waistline and lower back that comes and goes. Feels very stiff when she first wakes up. 8. PREGNANCY: "Is there any chance you are pregnant?" "When was your last menstrual period?"     n/a  Answer Assessment - Initial Assessment Questions 1. ONSET: "When did the pain begin?"      1 week  ago 2. LOCATION: "Where does it hurt?" (upper, mid or lower back)     Lower back to hip 3. SEVERITY: "How bad is the pain?"  (e.g., Scale 1-10; mild, moderate, or severe)   - MILD (1-3): doesn't interfere with normal activities    - MODERATE (4-7): interferes with normal activities or awakens from sleep    - SEVERE (8-10): excruciating pain, unable to do any normal activities      Moderate 6/10 4. PATTERN: "Is the pain constant?" (e.g., yes, no; constant, intermittent)      In the am  And hurst when she turns in bed it wakes her up 5. RADIATION: "Does the pain shoot into your legs or elsewhere?"     Yes hip to lower legs 6. CAUSE:  "What do you think is causing the back pain?"      Pt doesn't know 7. BACK OVERUSE:  "Any recent lifting of heavy objects, strenuous work or exercise?"     no 8. MEDICATIONS: "What have you taken so far for the pain?" (e.g., nothing, acetaminophen, NSAIDS)     acetaminophen 9. NEUROLOGIC SYMPTOMS: "Do you have any weakness, numbness, or problems with bowel/bladder control?"     Numbness and tingling to both lower legs 10. OTHER SYMPTOMS: "Do you have any other symptoms?" (e.g.,  fever, abdominal pain, burning with urination, blood in urine)       No  11. PREGNANCY: "Is there any chance you are pregnant?" (e.g., yes, no; LMP)       n/a  Protocols used: NEUROLOGIC DEFICIT-A-AH, BACK PAIN-A-AH

## 2017-07-07 ENCOUNTER — Ambulatory Visit (INDEPENDENT_AMBULATORY_CARE_PROVIDER_SITE_OTHER): Payer: 59 | Admitting: Internal Medicine

## 2017-07-07 ENCOUNTER — Encounter: Payer: Self-pay | Admitting: Internal Medicine

## 2017-07-07 ENCOUNTER — Encounter: Payer: Self-pay | Admitting: Neurology

## 2017-07-07 ENCOUNTER — Ambulatory Visit (INDEPENDENT_AMBULATORY_CARE_PROVIDER_SITE_OTHER)
Admission: RE | Admit: 2017-07-07 | Discharge: 2017-07-07 | Disposition: A | Discharge status: Home / Self Care | Payer: 59 | Source: Ambulatory Visit | Attending: Internal Medicine | Admitting: Internal Medicine

## 2017-07-07 VITALS — BP 140/90 | HR 64 | Temp 97.9°F | Wt 138.0 lb

## 2017-07-07 DIAGNOSIS — M545 Low back pain: Secondary | ICD-10-CM | POA: Diagnosis not present

## 2017-07-07 DIAGNOSIS — R200 Anesthesia of skin: Secondary | ICD-10-CM

## 2017-07-07 DIAGNOSIS — R202 Paresthesia of skin: Secondary | ICD-10-CM

## 2017-07-07 DIAGNOSIS — R030 Elevated blood-pressure reading, without diagnosis of hypertension: Secondary | ICD-10-CM

## 2017-07-07 DIAGNOSIS — R21 Rash and other nonspecific skin eruption: Secondary | ICD-10-CM | POA: Diagnosis not present

## 2017-07-07 MED ORDER — MELOXICAM 7.5 MG PO TABS: 7.5000 mg | ORAL_TABLET | Freq: Every day | ORAL | 0 refills | Status: DC

## 2017-07-07 NOTE — Patient Instructions (Addendum)
Please get  Back x ray to r/o surprises.   Could be pinched nerve in your back but atypical to be worse in am  So   Plan neurology referral and not sure if rash related.  You should be contacted    take antiinflammatory once a day for the next 10 - 14 days to see if helps   Inflammation around a nerve .

## 2017-07-09 ENCOUNTER — Telehealth: Payer: Self-pay | Admitting: Internal Medicine

## 2017-07-09 NOTE — Telephone Encounter (Signed)
Copied from [place] [id number]. Topic: General - Other >> Jul 09, 2017  5:12 PM Mcneil, Ja-Kwan wrote: Reason for CRM: Pt returned call for lab results. Pt requests a call back. Cb# [phone]

## 2017-07-12 NOTE — Telephone Encounter (Signed)
Charted in result notes. 

## 2017-07-21 ENCOUNTER — Telehealth: Payer: Self-pay | Admitting: Family Medicine

## 2017-07-21 NOTE — Telephone Encounter (Signed)
See lab results.  

## 2017-08-03 ENCOUNTER — Encounter: Payer: Self-pay | Admitting: Internal Medicine

## 2017-08-03 ENCOUNTER — Ambulatory Visit (INDEPENDENT_AMBULATORY_CARE_PROVIDER_SITE_OTHER): Payer: 59 | Admitting: Internal Medicine

## 2017-08-03 VITALS — BP 120/64 | HR 88 | Temp 98.2°F | Wt 141.3 lb

## 2017-08-03 DIAGNOSIS — R61 Generalized hyperhidrosis: Secondary | ICD-10-CM | POA: Diagnosis not present

## 2017-08-03 DIAGNOSIS — R7989 Other specified abnormal findings of blood chemistry: Secondary | ICD-10-CM | POA: Diagnosis not present

## 2017-08-03 DIAGNOSIS — R202 Paresthesia of skin: Secondary | ICD-10-CM | POA: Diagnosis not present

## 2017-08-03 DIAGNOSIS — R200 Anesthesia of skin: Secondary | ICD-10-CM

## 2017-08-03 DIAGNOSIS — R21 Rash and other nonspecific skin eruption: Secondary | ICD-10-CM | POA: Diagnosis not present

## 2017-08-03 LAB — POC URINALSYSI DIPSTICK (AUTOMATED)
BILIRUBIN UA: NEGATIVE
Blood, UA: NEGATIVE
GLUCOSE UA: NEGATIVE
Ketones, UA: NEGATIVE
Leukocytes, UA: NEGATIVE
NITRITE UA: NEGATIVE
Protein, UA: NEGATIVE
UROBILINOGEN UA: 0.2 U/dL
pH, UA: 6 (ref 5.0–8.0)

## 2017-08-03 LAB — BASIC METABOLIC PANEL
BUN: 14 mg/dL (ref 6–23)
CALCIUM: 9.3 mg/dL (ref 8.4–10.5)
CO2: 29 mEq/L (ref 19–32)
Chloride: 104 mEq/L (ref 96–112)
Creatinine, Ser: 0.7 mg/dL (ref 0.40–1.20)
GFR: 94.91 mL/min (ref >60.00)
GLUCOSE: 143 mg/dL — AB (ref 70–99)
POTASSIUM: 3.4 meq/L — AB (ref 3.5–5.1)
SODIUM: 141 meq/L (ref 135–145)

## 2017-08-03 LAB — CBC WITH DIFFERENTIAL/PLATELET
Basophils Absolute: 0.1 10*3/uL (ref 0.0–0.1)
Basophils Relative: 0.9 % (ref 0.0–3.0)
EOS PCT: 1.6 % (ref 0.0–5.0)
Eosinophils Absolute: 0.1 10*3/uL (ref 0.0–0.7)
HCT: 41.9 % (ref 36.0–46.0)
Hemoglobin: 14.2 g/dL (ref 12.0–15.0)
LYMPHS ABS: 2.3 10*3/uL (ref 0.7–4.0)
Lymphocytes Relative: 31.6 % (ref 12.0–46.0)
MCHC: 34 g/dL (ref 30.0–36.0)
MCV: 92.2 fl (ref 78.0–100.0)
MONOS PCT: 7 % (ref 3.0–12.0)
Monocytes Absolute: 0.5 10*3/uL (ref 0.1–1.0)
Neutro Abs: 4.4 10*3/uL (ref 1.4–7.7)
Neutrophils Relative %: 58.9 % (ref 43.0–77.0)
PLATELETS: 223 10*3/uL (ref 150.0–400.0)
RBC: 4.55 Mil/uL (ref 3.87–5.11)
RDW: 12.7 % (ref 11.5–15.5)
WBC: 7.4 10*3/uL (ref 4.0–10.5)

## 2017-08-03 LAB — HEPATIC FUNCTION PANEL
ALT: 14 U/L (ref 0–35)
AST: 19 U/L (ref 0–37)
Albumin: 4.3 g/dL (ref 3.5–5.2)
Alkaline Phosphatase: 51 U/L (ref 39–117)
BILIRUBIN TOTAL: 0.9 mg/dL (ref 0.2–1.2)
Bilirubin, Direct: 0.2 mg/dL (ref 0.0–0.3)
TOTAL PROTEIN: 7.5 g/dL (ref 6.0–8.3)

## 2017-08-03 LAB — HEMOGLOBIN A1C: Hgb A1c MFr Bld: 6.2 % (ref 4.6–6.5)

## 2017-08-03 LAB — TSH: TSH: 0.3 u[IU]/mL — AB (ref 0.35–4.50)

## 2017-08-03 LAB — T4, FREE: Free T4: 0.87 ng/dL (ref 0.60–1.60)

## 2017-08-03 LAB — SEDIMENTATION RATE: SED RATE: 18 mm/h (ref 0–20)

## 2017-08-03 LAB — VITAMIN B12: Vitamin B-12: 391 pg/mL (ref 211–911)

## 2017-08-03 LAB — C-REACTIVE PROTEIN: CRP: 0.2 mg/dL — AB (ref 0.5–20.0)

## 2017-08-03 LAB — T3, FREE: T3, Free: 3 pg/mL (ref 2.3–4.2)

## 2017-08-03 NOTE — Progress Notes (Signed)
Chief Complaint  Patient presents with  . spots on the legs    pt states the spots are spreading up the legs, some spots on the arms,  . Night Sweats    4-5 days    HPI: Janet Nichols 47 y.o. come in for   Skin issues    SDA   Dark spots on legs are getting more and traveling up to right  and some on  Upper  Arms .   Sweating upper body  Now  Not sure  Why. No fever  Weight loss . New meds .  Saw derm May    But not as bad then  Still has tingling le distal without weakness   .  Has appt with nero in the fall   ROS: See pertinent positives and negatives per HPI. No fever somconstipation ?  bms 3 x per week  As normal .   No blood sometimes green dark     Past Medical History:  Diagnosis Date  . Acne   . Gestational diabetes   . IC (interstitial cystitis)   . Low HDL (under 40)   . Rosacea     Family History  Problem Relation Age of Onset  . Hypertension Mother   . Diabetes Mother   . Hypertension Sister   . Breast cancer Cousin   . Kidney failure Mother        mom on dialysis has dn    Social History   Socioeconomic History  . Marital status: Married    Spouse name: Not on file  . Number of children: Not on file  . Years of education: Not on file  . Highest education level: Not on file  Occupational History  . Not on file  Social Needs  . Financial resource strain: Not on file  . Food insecurity:    Worry: Not on file    Inability: Not on file  . Transportation needs:    Medical: Not on file    Non-medical: Not on file  Tobacco Use  . Smoking status: Never Smoker  . Smokeless tobacco: Never Used  Substance and Sexual Activity  . Alcohol use: No    Alcohol/week: 0.0 oz  . Drug use: No  . Sexual activity: Not on file  Lifestyle  . Physical activity:    Days per week: Not on file    Minutes per session: Not on file  . Stress: Not on file  Relationships  . Social connections:    Talks on phone: Not on file    Gets together: Not on file    Attends  religious service: Not on file    Active member of club or organization: Not on file    Attends meetings of clubs or organizations: Not on file    Relationship status: Not on file  Other Topics Concern  . Not on file  Social History Narrative   Married   HH of 4       No pets   Sleep 7 hours   40 hours at least 10 hours days mon th friday   Exercise sometimes             Outpatient Medications Prior to Visit  Medication Sig Dispense Refill  . meloxicam (MOBIC) 7.5 MG tablet Take 1 tablet (7.5 mg total) by mouth daily. 30 tablet 0  . TOVIAZ 8 MG TB24 tablet      No facility-administered medications prior to visit.  EXAM:  BP 120/64 (BP Location: Right Arm, Patient Position: Sitting, Cuff Size: Normal)   Pulse 88   Temp 98.2 F (36.8 C) (Oral)   Wt 141 lb 4.8 oz (64.1 kg)   SpO2 100%   BMI 25.84 kg/m   Body mass index is 25.84 kg/m.  GENERAL: vitals reviewed and listed above, alert, oriented, appears well hydrated and in no acute distress HEENT: atraumatic, conjunctiva  clear, no obvious abnormalities on inspection of external nose and ears  NECK: no obvious masses on inspection palpation  LUNGS: clear to auscultation bilaterally, no wheezes, rales or rhonchi, good air movement CV: HRRR, no clubbing cyanosis or  peripheral edema nl cap refill  Abdomen:  Sof,t normal bowel sounds without hepatosplenomegaly, no guarding rebound or masses no CVA tenderness  MS: moves all extremities without noticeable focal  Abnormality Skin scattered leoiparldy looking dark spots  On le and scattered ones on thighs and  Upper ext  Palms and soles are normal   Facial erythema  Noted   One pink papule right knee area no vesicle  PSYCH: pleasant and cooperative, no obvious depression or anxiety Lab Results  Component Value Date   WBC 7.4 08/03/2017   HGB 14.2 08/03/2017   HCT 41.9 08/03/2017   PLT 223.0 08/03/2017   GLUCOSE 143 (H) 08/03/2017   CHOL 156 09/09/2016   TRIG 149.0  09/09/2016   HDL 35.90 (L) 09/09/2016   LDLCALC 90 09/09/2016   ALT 14 08/03/2017   AST 19 08/03/2017   NA 141 08/03/2017   K 3.4 (L) 08/03/2017   CL 104 08/03/2017   CREATININE 0.70 08/03/2017   BUN 14 08/03/2017   CO2 29 08/03/2017   TSH 0.30 (L) 08/03/2017   HGBA1C 6.2 08/03/2017   BP Readings from Last 3 Encounters:  08/03/17 120/64  07/07/17 140/90  09/09/16 116/70    ASSESSMENT AND PLAN:  Discussed the following assessment and plan:  Rash hyperpigmented spots  - Plan: Basic metabolic panel, CBC with Differential/Platelet, Hemoglobin A1c, Hepatic function panel, TSH, T4, free, T3, free, POCT Urinalysis Dipstick (Automated), Sedimentation rate, C-reactive protein, Vitamin B12  Excessive sweating - Plan: Basic metabolic panel, CBC with Differential/Platelet, Hemoglobin A1c, Hepatic function panel, TSH, T4, free, T3, free, POCT Urinalysis Dipstick (Automated), Sedimentation rate, C-reactive protein  Numbness and tingling of both legs below knees - Plan: Vitamin Z61 not certain why pigmentation rash  ? Post inflammaotry but  Continuing and  Spreading   spots .    sweats's could be perimenopausal but need to check  Chem cbc  bg and   b12 etc .  Still has tingling but not sure if related .  See  Derm again  Since getting worse .  -Patient advised to return or notify health care team  if  new concerns arise.  Patient Instructions  Checking  Blood  Tests for thyroid sugar anemia liver etc .  And Urine check.  Not  sures why you are having the rash  Get the dermatologist to see again since spreading.     Standley Brooking. Fong Mccarry M.D.

## 2017-08-03 NOTE — Patient Instructions (Signed)
Checking  Blood  Tests for thyroid sugar anemia liver etc .  And Urine check.  Not  sures why you are having the rash  Get the dermatologist to see again since spreading.

## 2017-08-09 NOTE — Addendum Note (Signed)
Addended by: Agnes Lawrence on: 08/09/2017 10:43 AM   Modules accepted: Orders

## 2017-08-12 DIAGNOSIS — H10813 Pingueculitis, bilateral: Secondary | ICD-10-CM | POA: Diagnosis not present

## 2017-08-25 DIAGNOSIS — L986 Other infiltrative disorders of the skin and subcutaneous tissue: Secondary | ICD-10-CM | POA: Diagnosis not present

## 2017-08-25 DIAGNOSIS — L308 Other specified dermatitis: Secondary | ICD-10-CM | POA: Diagnosis not present

## 2017-08-25 DIAGNOSIS — D2361 Other benign neoplasm of skin of right upper limb, including shoulder: Secondary | ICD-10-CM | POA: Diagnosis not present

## 2017-08-26 ENCOUNTER — Other Ambulatory Visit (INDEPENDENT_AMBULATORY_CARE_PROVIDER_SITE_OTHER): Payer: 59

## 2017-08-26 DIAGNOSIS — R7989 Other specified abnormal findings of blood chemistry: Secondary | ICD-10-CM

## 2017-08-26 LAB — T4, FREE: FREE T4: 0.73 ng/dL (ref 0.60–1.60)

## 2017-08-26 LAB — TSH: TSH: 0.52 u[IU]/mL (ref 0.35–4.50)

## 2017-08-27 ENCOUNTER — Other Ambulatory Visit: Payer: Self-pay | Admitting: Internal Medicine

## 2017-08-27 DIAGNOSIS — R7989 Other specified abnormal findings of blood chemistry: Secondary | ICD-10-CM

## 2017-08-31 ENCOUNTER — Other Ambulatory Visit: Payer: 59

## 2017-09-01 LAB — TEST AUTHORIZATION

## 2017-09-01 LAB — THYROID ANTIBODIES
Thyroglobulin Ab: <1 IU/mL (ref <=1)
Thyroperoxidase Ab SerPl-aCnc: <1 IU/mL (ref <9)

## 2017-09-01 LAB — THYROGLOBULIN LEVEL: THYROGLOBULIN: 26.1 ng/mL

## 2017-09-10 NOTE — Progress Notes (Signed)
Chief Complaint  Patient presents with  . Annual Exam    no new concerns    HPI: Patient  Janet Nichols  47 y.o. comes in today for Preventive Health Care visit  Rash  bx per derm using some cream .  Health Maintenance  Topic Date Due  . MAMMOGRAM  04/06/2014  . PAP SMEAR  09/10/2019  . TETANUS/TDAP  09/14/2027  . INFLUENZA VACCINE  Completed  . HIV Screening  Completed   Health Maintenance Review LIFESTYLE:  Exercise:   [place]mill Tobacco/ETS: noAlcohol:  no Sugar beverages:  Sleep:6-7 Drug use: no HH of  2+ Work:  '12  2 2 3  '$ Otaion.  LMP 8 mos ago   ocass hotp flush no unusual bleeding   mammo tomorrow  ? Vision   ROS:  GEN/ HEENT: No fever, significant weight changes sweats headaches vision problems hearing changes, CV/ PULM; No chest pain shortness of breath cough, syncope,edema  change in exercise tolerance. GI /GU: No adominal pain, vomiting, change in bowel habits. No blood in the stool. No significant GU symptoms. SKIN/HEME: ,nosuspicious lesions or bleeding. No lymphadenopathy, nodules, masses.  NEURO/ PSYCH:  No neurologic signs such as weakness numbness. No depression anxiety. IMM/ Allergy: No unusual infections.  Allergy .   REST of 12 system review negative except as per HPI   Past Medical History:  Diagnosis Date  . Acne   . Gestational diabetes   . IC (interstitial cystitis)   . Low HDL (under 40)   . Rosacea     Past Surgical History:  Procedure Laterality Date  . bladder stretched    . ESSURE TUBAL LIGATION  2011    Family History  Problem Relation Age of Onset  . Hypertension Mother   . Diabetes Mother   . Hypertension Sister   . Breast cancer Cousin   . Kidney failure Mother        mom on dialysis has dn    Social History   Socioeconomic History  . Marital status: Married    Spouse name: Not on file  . Number of children: Not on file  . Years of education: Not on file  . Highest education level: Not on file  Occupational  History  . Not on file  Social Needs  . Financial resource strain: Not on file  . Food insecurity:    Worry: Not on file    Inability: Not on file  . Transportation needs:    Medical: Not on file    Non-medical: Not on file  Tobacco Use  . Smoking status: Never Smoker  . Smokeless tobacco: Never Used  Substance and Sexual Activity  . Alcohol use: No    Alcohol/week: 0.0 standard drinks  . Drug use: No  . Sexual activity: Not on file  Lifestyle  . Physical activity:    Days per week: Not on file    Minutes per session: Not on file  . Stress: Not on file  Relationships  . Social connections:    Talks on phone: Not on file    Gets together: Not on file    Attends religious service: Not on file    Active member of club or organization: Not on file    Attends meetings of clubs or organizations: Not on file    Relationship status: Not on file  Other Topics Concern  . Not on file  Social History Narrative   Married   [place] of 4  No pets   Sleep 7 hours   40 hours at least 10 hours days mon th friday   Exercise sometimes             Outpatient Medications Prior to Visit  Medication Sig Dispense Refill  . augmented betamethasone dipropionate (DIPROLENE-AF) 0.05 % cream APPLY CREAM TOPICALLY TO AFFECTED AREA UP TO TWICE DAILY AS NEEDED. DO NOT USE ON FACE GROIN UNDERARMS.  3  . meloxicam (MOBIC) 7.5 MG tablet Take 1 tablet (7.5 mg total) by mouth daily. 30 tablet 0  . TOVIAZ 8 MG TB24 tablet      No facility-administered medications prior to visit.      EXAM:  BP 102/72 (BP Location: Right Arm, Patient Position: Sitting, Cuff Size: Normal)   Pulse 62   Temp 98.2 F (36.8 C) (Oral)   Ht '5\' 2"'$  (1.575 m)   Wt 140 lb 4.8 oz (63.6 kg)   BMI 25.66 kg/m   Body mass index is 25.66 kg/m. Wt Readings from Last 3 Encounters:  09/13/17 140 lb 4.8 oz (63.6 kg)  08/03/17 141 lb 4.8 oz (64.1 kg)  07/07/17 138 lb (62.6 kg)    Physical Exam: Vital signs  reviewed MWN:UUVO is a well-developed well-nourished alert cooperative    who appearsr stated age in no acute distress.  HEENT: normocephalic atraumatic , Eyes: PERRL EOM's full, conjunctiva clear, Nares: paten,t no deformity discharge or tenderness., Ears: no deformity EAC's clear TMs with normal landmarks. Mouth: clear OP, no lesions, edema.  Moist mucous membranes. Dentition in adequate repair. NECK: supple without masses, thyromegaly or bruits. CHEST/PULM:  Clear to auscultation and percussion breath sounds equal no wheeze , rales or rhonchi. No chest wall deformities or tenderness. Breast: normal by inspection . No dimpling, discharge, masses, tenderness or discharge . CV: PMI is nondisplaced, S1 S2 no gallops, murmurs, rubs. Peripheral pulses are full without delay.No JVD .  ABDOMEN: Bowel sounds normal nontender  No guard or rebound, no hepato splenomegal no CVA tenderness.  No hernia. Extremtities:  No clubbing cyanosis or edema, no acute joint swelling or redness no focal atrophy NEURO:  Oriented x3, cranial nerves 3-12 appear to be intact, no obvious focal weakness,gait within normal limits no abnormal reflexes or asymmetrical SKIN: le  pigment normal turgor, color, no bruising or petechiae. PSYCH: Oriented, good eye contact, no obvious depression anxiety, cognition and judgment appear normal. LN: no cervical axillary inguinal adenopathy  Lab Results  Component Value Date   WBC 7.4 08/03/2017   HGB 14.2 08/03/2017   HCT 41.9 08/03/2017   PLT 223.0 08/03/2017   GLUCOSE 87 09/13/2017   CHOL 149 09/13/2017   TRIG 138.0 09/13/2017   HDL 40.50 09/13/2017   LDLCALC 81 09/13/2017   ALT 14 08/03/2017   AST 19 08/03/2017   NA 141 08/03/2017   K 3.4 (L) 08/03/2017   CL 104 08/03/2017   CREATININE 0.70 08/03/2017   BUN 14 08/03/2017   CO2 29 08/03/2017   TSH 0.52 08/26/2017   HGBA1C 6.2 08/03/2017    BP Readings from Last 3 Encounters:  09/13/17 102/72  08/03/17 120/64   07/07/17 140/90      ASSESSMENT AND PLAN:  Discussed the following assessment and plan:  Visit for preventive health examination  Low HDL (under 40) - Plan: Lipid panel, Glucose, Random  Hyperglycemia - Plan: Lipid panel, Glucose, Random  Family history of diabetes mellitus - Plan: Lipid panel, Glucose, Random  Amenorrhea - Plan: Follicle Stimulating Hormone  Immunity status testing - Plan: Hepatitis B surface antigen, Hepatitis B surface antibody,qualitative  Screening for HIV (human immunodeficiency virus) - Plan: HIV antibody  Need for Tdap vaccination - Plan: Tdap vaccine greater than or equal to 7yo IM  Need for influenza vaccination - Plan: Flu Vaccine QUAD 6+ mos PF IM (Fluarix Quad PF)  Hyperglycemia  a1c  Pre diabetic  Lab and  Will do hep screen as she is unsure if had hep b  vaccine but  Did have school entry   See lab  Hep b screen pos ag  Will add  furhter testing and plan fu  Patient Care Team: Panosh, Standley Brooking, MD as PCP - Hubbard Robinson Sharyon Cable, MD as Referring Physician (Urology) Garald Balding, MD (Orthopedic Surgery) Brien Few, MD as Attending Physician (Obstetrics and Gynecology) dermatologist  Patient Instructions   Avoid processed carbohydrates and sugars including rice .  You should get  Colonoscopy at age 23 years .   Fu yearly or 6 mos  depending on blood sugars   Health Maintenance, Female Adopting a healthy lifestyle and getting preventive care can go a long way to promote health and wellness. Talk with your health care provider about what schedule of regular examinations is right for you. This is a good chance for you to check in with your provider about disease prevention and staying healthy. In between checkups, there are plenty of things you can do on your own. Experts have done a lot of research about which lifestyle changes and preventive measures are most likely to keep you healthy. Ask your health care provider for more  information. Weight and diet Eat a healthy diet  Be sure to include plenty of vegetables, fruits, low-fat dairy products, and lean protein.  Do not eat a lot of foods high in solid fats, added sugars, or salt.  Get regular exercise. This is one of the most important things you can do for your health. ? Most adults should exercise for at least 150 minutes each week. The exercise should increase your heart rate and make you sweat (moderate-intensity exercise). ? Most adults should also do strengthening exercises at least twice a week. This is in addition to the moderate-intensity exercise.  Maintain a healthy weight  Body mass index (BMI) is a measurement that can be used to identify possible weight problems. It estimates body fat based on height and weight. Your health care provider can help determine your BMI and help you achieve or maintain a healthy weight.  For females 17 years of age and older: ? A BMI below 18.5 is considered underweight. ? A BMI of 18.5 to 24.9 is normal. ? A BMI of 25 to 29.9 is considered overweight. ? A BMI of 30 and above is considered obese.  Watch levels of cholesterol and blood lipids  You should start having your blood tested for lipids and cholesterol at 48 years of age, then have this test every 5 years.  You may need to have your cholesterol levels checked more often if: ? Your lipid or cholesterol levels are high. ? You are older than 48 years of age. ? You are at high risk for heart disease.  Cancer screening Lung Cancer  Lung cancer screening is recommended for adults 42-14 years old who are at high risk for lung cancer because of a history of smoking.  A yearly low-dose CT scan of the lungs is recommended for people who: ? Currently smoke. ? Have quit within  the past 15 years. ? Have at least a 30-pack-year history of smoking. A pack year is smoking an average of one pack of cigarettes a day for 1 year.  Yearly screening should continue  until it has been 15 years since you quit.  Yearly screening should stop if you develop a health problem that would prevent you from having lung cancer treatment.  Breast Cancer  Practice breast self-awareness. This means understanding how your breasts normally appear and feel.  It also means doing regular breast self-exams. Let your health care provider know about any changes, no matter how small.  If you are in your 20s or 30s, you should have a clinical breast exam (CBE) by a health care provider every 1-3 years as part of a regular health exam.  If you are 60 or older, have a CBE every year. Also consider having a breast X-ray (mammogram) every year.  If you have a family history of breast cancer, talk to your health care provider about genetic screening.  If you are at high risk for breast cancer, talk to your health care provider about having an MRI and a mammogram every year.  Breast cancer gene (BRCA) assessment is recommended for women who have family members with BRCA-related cancers. BRCA-related cancers include: ? Breast. ? Ovarian. ? Tubal. ? Peritoneal cancers.  Results of the assessment will determine the need for genetic counseling and BRCA1 and BRCA2 testing.  Cervical Cancer Your health care provider may recommend that you be screened regularly for cancer of the pelvic organs (ovaries, uterus, and vagina). This screening involves a pelvic examination, including checking for microscopic changes to the surface of your cervix (Pap test). You may be encouraged to have this screening done every 3 years, beginning at age 4.  For women ages 32-65, health care providers may recommend pelvic exams and Pap testing every 3 years, or they may recommend the Pap and pelvic exam, combined with testing for human papilloma virus (HPV), every 5 years. Some types of HPV increase your risk of cervical cancer. Testing for HPV may also be done on women of any age with unclear Pap test  results.  Other health care providers may not recommend any screening for nonpregnant women who are considered low risk for pelvic cancer and who do not have symptoms. Ask your health care provider if a screening pelvic exam is right for you.  If you have had past treatment for cervical cancer or a condition that could lead to cancer, you need Pap tests and screening for cancer for at least 20 years after your treatment. If Pap tests have been discontinued, your risk factors (such as having a new sexual partner) need to be reassessed to determine if screening should resume. Some women have medical problems that increase the chance of getting cervical cancer. In these cases, your health care provider may recommend more frequent screening and Pap tests.  Colorectal Cancer  This type of cancer can be detected and often prevented.  Routine colorectal cancer screening usually begins at 48 years of age and continues through 48 years of age.  Your health care provider may recommend screening at an earlier age if you have risk factors for colon cancer.  Your health care provider may also recommend using home test kits to check for hidden blood in the stool.  A small camera at the end of a tube can be used to examine your colon directly (sigmoidoscopy or colonoscopy). This is done to check for  the earliest forms of colorectal cancer.  Routine screening usually begins at age 72.  Direct examination of the colon should be repeated every 5-10 years through 48 years of age. However, you may need to be screened more often if early forms of precancerous polyps or small growths are found.  Skin Cancer  Check your skin from head to toe regularly.  Tell your health care provider about any new moles or changes in moles, especially if there is a change in a mole's shape or color.  Also tell your health care provider if you have a mole that is larger than the size of a pencil eraser.  Always use sunscreen.  Apply sunscreen liberally and repeatedly throughout the day.  Protect yourself by wearing long sleeves, pants, a wide-brimmed hat, and sunglasses whenever you are outside.  Heart disease, diabetes, and high blood pressure  High blood pressure causes heart disease and increases the risk of stroke. High blood pressure is more likely to develop in: ? People who have blood pressure in the high end of the normal range (130-139/85-89 mm Hg). ? People who are overweight or obese. ? People who are African American.  If you are 21-91 years of age, have your blood pressure checked every 3-5 years. If you are 110 years of age or older, have your blood pressure checked every year. You should have your blood pressure measured twice-once when you are at a hospital or clinic, and once when you are not at a hospital or clinic. Record the average of the two measurements. To check your blood pressure when you are not at a hospital or clinic, you can use: ? An automated blood pressure machine at a pharmacy. ? A home blood pressure monitor.  If you are between 64 years and 15 years old, ask your health care provider if you should take aspirin to prevent strokes.  Have regular diabetes screenings. This involves taking a blood sample to check your fasting blood sugar level. ? If you are at a normal weight and have a low risk for diabetes, have this test once every three years after 48 years of age. ? If you are overweight and have a high risk for diabetes, consider being tested at a younger age or more often. Preventing infection Hepatitis B  If you have a higher risk for hepatitis B, you should be screened for this virus. You are considered at high risk for hepatitis B if: ? You were born in a country where hepatitis B is common. Ask your health care provider which countries are considered high risk. ? Your parents were born in a high-risk country, and you have not been immunized against hepatitis B (hepatitis B  vaccine). ? You have HIV or AIDS. ? You use needles to inject street drugs. ? You live with someone who has hepatitis B. ? You have had sex with someone who has hepatitis B. ? You get hemodialysis treatment. ? You take certain medicines for conditions, including cancer, organ transplantation, and autoimmune conditions.  Hepatitis C  Blood testing is recommended for: ? Everyone born from [birth year] through 1966. ? Anyone with known risk factors for hepatitis C.  Sexually transmitted infections (STIs)  You should be screened for sexually transmitted infections (STIs) including gonorrhea and chlamydia if: ? You are sexually active and are younger than 48 years of age. ? You are older than 48 years of age and your health care provider tells you that you are at risk for this  type of infection. ? Your sexual activity has changed since you were last screened and you are at an increased risk for chlamydia or gonorrhea. Ask your health care provider if you are at risk.  If you do not have HIV, but are at risk, it may be recommended that you take a prescription medicine daily to prevent HIV infection. This is called pre-exposure prophylaxis (PrEP). You are considered at risk if: ? You are sexually active and do not regularly use condoms or know the HIV status of your partner(s). ? You take drugs by injection. ? You are sexually active with a partner who has HIV.  Talk with your health care provider about whether you are at high risk of being infected with HIV. If you choose to begin PrEP, you should first be tested for HIV. You should then be tested every 3 months for as long as you are taking PrEP. Pregnancy  If you are premenopausal and you may become pregnant, ask your health care provider about preconception counseling.  If you may become pregnant, take 400 to 800 micrograms (mcg) of folic acid every day.  If you want to prevent pregnancy, talk to your health care provider about birth control  (contraception). Osteoporosis and menopause  Osteoporosis is a disease in which the bones lose minerals and strength with aging. This can result in serious bone fractures. Your risk for osteoporosis can be identified using a bone density scan.  If you are 48 years of age or older, or if you are at risk for osteoporosis and fractures, ask your health care provider if you should be screened.  Ask your health care provider whether you should take a calcium or vitamin D supplement to lower your risk for osteoporosis.  Menopause may have certain physical symptoms and risks.  Hormone replacement therapy may reduce some of these symptoms and risks. Talk to your health care provider about whether hormone replacement therapy is right for you. Follow these instructions at home:  Schedule regular health, dental, and eye exams.  Stay current with your immunizations.  Do not use any tobacco products including cigarettes, chewing tobacco, or electronic cigarettes.  If you are pregnant, do not drink alcohol.  If you are breastfeeding, limit how much and how often you drink alcohol.  Limit alcohol intake to no more than 1 drink per day for nonpregnant women. One drink equals 12 ounces of beer, 5 ounces of wine, or 1 ounces of hard liquor.  Do not use street drugs.  Do not share needles.  Ask your health care provider for help if you need support or information about quitting drugs.  Tell your health care provider if you often feel depressed.  Tell your health care provider if you have ever been abused or do not feel safe at home. This information is not intended to replace advice given to you by your health care provider. Make sure you discuss any questions you have with your health care provider. Document Released: 07/07/2010 Document Revised: 05/30/2015 Document Reviewed: 09/25/2014 Elsevier Interactive Patient Education  2018 [place] Maintenance, Female Adopting a  healthy lifestyle and getting preventive care can go a long way to promote health and wellness. Talk with your health care provider about what schedule of regular examinations is right for you. This is a good chance for you to check in with your provider about disease prevention and staying healthy. In between checkups, there are plenty of things you can do on your  own. Experts have done a lot of research about which lifestyle changes and preventive measures are most likely to keep you healthy. Ask your health care provider for more information. Weight and diet Eat a healthy diet  Be sure to include plenty of vegetables, fruits, low-fat dairy products, and lean protein.  Do not eat a lot of foods high in solid fats, added sugars, or salt.  Get regular exercise. This is one of the most important things you can do for your health. ? Most adults should exercise for at least 150 minutes each week. The exercise should increase your heart rate and make you sweat (moderate-intensity exercise). ? Most adults should also do strengthening exercises at least twice a week. This is in addition to the moderate-intensity exercise.  Maintain a healthy weight  Body mass index (BMI) is a measurement that can be used to identify possible weight problems. It estimates body fat based on height and weight. Your health care provider can help determine your BMI and help you achieve or maintain a healthy weight.  For females 9 years of age and older: ? A BMI below 18.5 is considered underweight. ? A BMI of 18.5 to 24.9 is normal. ? A BMI of 25 to 29.9 is considered overweight. ? A BMI of 30 and above is considered obese.  Watch levels of cholesterol and blood lipids  You should start having your blood tested for lipids and cholesterol at 48 years of age, then have this test every 5 years.  You may need to have your cholesterol levels checked more often if: ? Your lipid or cholesterol levels are high. ? You are  older than 48 years of age. ? You are at high risk for heart disease.  Cancer screening Lung Cancer  Lung cancer screening is recommended for adults 37-59 years old who are at high risk for lung cancer because of a history of smoking.  A yearly low-dose CT scan of the lungs is recommended for people who: ? Currently smoke. ? Have quit within the past 15 years. ? Have at least a 30-pack-year history of smoking. A pack year is smoking an average of one pack of cigarettes a day for 1 year.  Yearly screening should continue until it has been 15 years since you quit.  Yearly screening should stop if you develop a health problem that would prevent you from having lung cancer treatment.  Breast Cancer  Practice breast self-awareness. This means understanding how your breasts normally appear and feel.  It also means doing regular breast self-exams. Let your health care provider know about any changes, no matter how small.  If you are in your 20s or 30s, you should have a clinical breast exam (CBE) by a health care provider every 1-3 years as part of a regular health exam.  If you are 24 or older, have a CBE every year. Also consider having a breast X-ray (mammogram) every year.  If you have a family history of breast cancer, talk to your health care provider about genetic screening.  If you are at high risk for breast cancer, talk to your health care provider about having an MRI and a mammogram every year.  Breast cancer gene (BRCA) assessment is recommended for women who have family members with BRCA-related cancers. BRCA-related cancers include: ? Breast. ? Ovarian. ? Tubal. ? Peritoneal cancers.  Results of the assessment will determine the need for genetic counseling and BRCA1 and BRCA2 testing.  Cervical Cancer Your health  care provider may recommend that you be screened regularly for cancer of the pelvic organs (ovaries, uterus, and vagina). This screening involves a pelvic  examination, including checking for microscopic changes to the surface of your cervix (Pap test). You may be encouraged to have this screening done every 3 years, beginning at age 82.  For women ages 42-65, health care providers may recommend pelvic exams and Pap testing every 3 years, or they may recommend the Pap and pelvic exam, combined with testing for human papilloma virus (HPV), every 5 years. Some types of HPV increase your risk of cervical cancer. Testing for HPV may also be done on women of any age with unclear Pap test results.  Other health care providers may not recommend any screening for nonpregnant women who are considered low risk for pelvic cancer and who do not have symptoms. Ask your health care provider if a screening pelvic exam is right for you.  If you have had past treatment for cervical cancer or a condition that could lead to cancer, you need Pap tests and screening for cancer for at least 20 years after your treatment. If Pap tests have been discontinued, your risk factors (such as having a new sexual partner) need to be reassessed to determine if screening should resume. Some women have medical problems that increase the chance of getting cervical cancer. In these cases, your health care provider may recommend more frequent screening and Pap tests.  Colorectal Cancer  This type of cancer can be detected and often prevented.  Routine colorectal cancer screening usually begins at 48 years of age and continues through 48 years of age.  Your health care provider may recommend screening at an earlier age if you have risk factors for colon cancer.  Your health care provider may also recommend using home test kits to check for hidden blood in the stool.  A small camera at the end of a tube can be used to examine your colon directly (sigmoidoscopy or colonoscopy). This is done to check for the earliest forms of colorectal cancer.  Routine screening usually begins at age  23.  Direct examination of the colon should be repeated every 5-10 years through 48 years of age. However, you may need to be screened more often if early forms of precancerous polyps or small growths are found.  Skin Cancer  Check your skin from head to toe regularly.  Tell your health care provider about any new moles or changes in moles, especially if there is a change in a mole's shape or color.  Also tell your health care provider if you have a mole that is larger than the size of a pencil eraser.  Always use sunscreen. Apply sunscreen liberally and repeatedly throughout the day.  Protect yourself by wearing long sleeves, pants, a wide-brimmed hat, and sunglasses whenever you are outside.  Heart disease, diabetes, and high blood pressure  High blood pressure causes heart disease and increases the risk of stroke. High blood pressure is more likely to develop in: ? People who have blood pressure in the high end of the normal range (130-139/85-89 mm Hg). ? People who are overweight or obese. ? People who are African American.  If you are 81-98 years of age, have your blood pressure checked every 3-5 years. If you are 80 years of age or older, have your blood pressure checked every year. You should have your blood pressure measured twice-once when you are at a hospital or clinic, and once  when you are not at a hospital or clinic. Record the average of the two measurements. To check your blood pressure when you are not at a hospital or clinic, you can use: ? An automated blood pressure machine at a pharmacy. ? A home blood pressure monitor.  If you are between 23 years and 41 years old, ask your health care provider if you should take aspirin to prevent strokes.  Have regular diabetes screenings. This involves taking a blood sample to check your fasting blood sugar level. ? If you are at a normal weight and have a low risk for diabetes, have this test once every three years after 48  years of age. ? If you are overweight and have a high risk for diabetes, consider being tested at a younger age or more often. Preventing infection Hepatitis B  If you have a higher risk for hepatitis B, you should be screened for this virus. You are considered at high risk for hepatitis B if: ? You were born in a country where hepatitis B is common. Ask your health care provider which countries are considered high risk. ? Your parents were born in a high-risk country, and you have not been immunized against hepatitis B (hepatitis B vaccine). ? You have HIV or AIDS. ? You use needles to inject street drugs. ? You live with someone who has hepatitis B. ? You have had sex with someone who has hepatitis B. ? You get hemodialysis treatment. ? You take certain medicines for conditions, including cancer, organ transplantation, and autoimmune conditions.  Hepatitis C  Blood testing is recommended for: ? Everyone born from [birth year] through 1966. ? Anyone with known risk factors for hepatitis C.  Sexually transmitted infections (STIs)  You should be screened for sexually transmitted infections (STIs) including gonorrhea and chlamydia if: ? You are sexually active and are younger than 48 years of age. ? You are older than 48 years of age and your health care provider tells you that you are at risk for this type of infection. ? Your sexual activity has changed since you were last screened and you are at an increased risk for chlamydia or gonorrhea. Ask your health care provider if you are at risk.  If you do not have HIV, but are at risk, it may be recommended that you take a prescription medicine daily to prevent HIV infection. This is called pre-exposure prophylaxis (PrEP). You are considered at risk if: ? You are sexually active and do not regularly use condoms or know the HIV status of your partner(s). ? You take drugs by injection. ? You are sexually active with a partner who has HIV.  Talk  with your health care provider about whether you are at high risk of being infected with HIV. If you choose to begin PrEP, you should first be tested for HIV. You should then be tested every 3 months for as long as you are taking PrEP. Pregnancy  If you are premenopausal and you may become pregnant, ask your health care provider about preconception counseling.  If you may become pregnant, take 400 to 800 micrograms (mcg) of folic acid every day.  If you want to prevent pregnancy, talk to your health care provider about birth control (contraception). Osteoporosis and menopause  Osteoporosis is a disease in which the bones lose minerals and strength with aging. This can result in serious bone fractures. Your risk for osteoporosis can be identified using a bone density scan.  If you  are 41 years of age or older, or if you are at risk for osteoporosis and fractures, ask your health care provider if you should be screened.  Ask your health care provider whether you should take a calcium or vitamin D supplement to lower your risk for osteoporosis.  Menopause may have certain physical symptoms and risks.  Hormone replacement therapy may reduce some of these symptoms and risks. Talk to your health care provider about whether hormone replacement therapy is right for you. Follow these instructions at home:  Schedule regular health, dental, and eye exams.  Stay current with your immunizations.  Do not use any tobacco products including cigarettes, chewing tobacco, or electronic cigarettes.  If you are pregnant, do not drink alcohol.  If you are breastfeeding, limit how much and how often you drink alcohol.  Limit alcohol intake to no more than 1 drink per day for nonpregnant women. One drink equals 12 ounces of beer, 5 ounces of wine, or 1 ounces of hard liquor.  Do not use street drugs.  Do not share needles.  Ask your health care provider for help if you need support or information  about quitting drugs.  Tell your health care provider if you often feel depressed.  Tell your health care provider if you have ever been abused or do not feel safe at home. This information is not intended to replace advice given to you by your health care provider. Make sure you discuss any questions you have with your health care provider. Document Released: 07/07/2010 Document Revised: 05/30/2015 Document Reviewed: 09/25/2014 Elsevier Interactive Patient Education  2018 [employer].    Preventing Type 2 Diabetes Mellitus Type 2 diabetes (type 2 diabetes mellitus) is a long-term (chronic) disease that affects blood sugar (glucose) levels. Normally, a hormone called insulin allows glucose to enter cells in the body. The cells use glucose for energy. In type 2 diabetes, one or both of these problems may be present:  The body does not make enough insulin.  The body does not respond properly to insulin that it makes (insulin resistance).  Insulin resistance or lack of insulin causes excess glucose to build up in the blood instead of going into cells. As a result, high blood glucose (hyperglycemia) develops, which can cause many complications. Being overweight or obese and having an inactive (sedentary) lifestyle can increase your risk for diabetes. Type 2 diabetes can be delayed or prevented by making certain nutrition and lifestyle changes. What nutrition changes can be made?  Eat healthy meals and snacks regularly. Keep a healthy snack with you for when you get hungry between meals, such as fruit or a handful of nuts.  Eat lean meats and proteins that are low in saturated fats, such as chicken, fish, egg whites, and beans. Avoid processed meats.  Eat plenty of fruits and vegetables and plenty of grains that have not been processed (whole grains). It is recommended that you eat: ? 1?2 cups of fruit every day. ? 2?3 cups of vegetables every day. ? 6?8 oz of whole grains every day, such  as oats, whole wheat, bulgur, brown rice, quinoa, and millet.  Eat low-fat dairy products, such as milk, yogurt, and cheese.  Eat foods that contain healthy fats, such as nuts, avocado, olive oil, and canola oil.  Drink water throughout the day. Avoid drinks that contain added sugar, such as soda or sweet tea.  Follow instructions from your health care provider about specific eating or drinking restrictions.  Control how  much food you eat at a time (portion size). ? Check food labels to find out the serving sizes of foods. ? Use a kitchen scale to weigh amounts of foods.  Saute or steam food instead of frying it. Cook with water or broth instead of oils or butter.  Limit your intake of: ? Salt (sodium). Have no more than 1 tsp (2,400 mg) of sodium a day. If you have heart disease or high blood pressure, have less than ? tsp (1,500 mg) of sodium a day. ? Saturated fat. This is fat that is solid at room temperature, such as butter or fat on meat. What lifestyle changes can be made?  Activity  Do moderate-intensity physical activity for at least 30 minutes on at least 5 days of the week, or as much as told by your health care provider.  Ask your health care provider what activities are safe for you. A mix of physical activities may be best, such as walking, swimming, cycling, and strength training.  Try to add physical activity into your day. For example: ? Park in spots that are farther away than usual, so that you walk more. For example, park in a far corner of the parking lot when you go to the office or the grocery store. ? Take a walk during your lunch break. ? Use stairs instead of elevators or escalators. Weight Loss  Lose weight as directed. Your health care provider can determine how much weight loss is best for you and can help you lose weight safely.  If you are overweight or obese, you may be instructed to lose at least 5?7 % of your body weight. Alcohol and  Tobacco   Limit alcohol intake to no more than 1 drink a day for nonpregnant women and 2 drinks a day for men. One drink equals 12 oz of beer, 5 oz of wine, or 1 oz of hard liquor.  Do not use any tobacco products, such as cigarettes, chewing tobacco, and e-cigarettes. If you need help quitting, ask your health care provider. Work With [place] [facility] Provider  Have your blood glucose tested regularly, as told by your health care provider.  Discuss your risk factors and how you can reduce your risk for diabetes.  Get screening tests as told by your health care provider. You may have screening tests regularly, especially if you have certain risk factors for type 2 diabetes.  Make an appointment with a diet and nutrition specialist (registered dietitian). A registered dietitian can help you make a healthy eating plan and can help you understand portion sizes and food labels. Why are these changes important?  It is possible to prevent or delay type 2 diabetes and related health problems by making lifestyle and nutrition changes.  It can be difficult to recognize signs of type 2 diabetes. The best way to avoid possible damage to your body is to take actions to prevent the disease before you develop symptoms. What can happen if changes are not made?  Your blood glucose levels may keep increasing. Having high blood glucose for a long time is dangerous. Too much glucose in your blood can damage your blood vessels, heart, kidneys, nerves, and eyes.  You may develop prediabetes or type 2 diabetes. Type 2 diabetes can lead to many chronic health problems and complications, such as: ? Heart disease. ? Stroke. ? Blindness. ? Kidney disease. ? Depression. ? Poor circulation in the feet and legs, which could lead to surgical removal (amputation)  in severe cases. Where to find support:  Ask your health care provider to recommend a registered dietitian, diabetes educator, or weight loss  program.  Look for local or online weight loss groups.  Join a gym, fitness club, or outdoor activity group, such as a walking club. Where to find more information: To learn more about diabetes and diabetes prevention, visit:  American Diabetes Association (ADA): www.diabetes.[employer] of Diabetes and Digestive and Kidney Diseases: FindSpin.nl  To learn more about healthy eating, visit:  The U.[place]. Department of Agriculture Scientist, research (physical sciences)), Choose My Plate: http://wiley-williams.com/  Office of Disease Prevention and Health Promotion (ODPHP), Dietary Guidelines: SurferLive.at  Summary  You can reduce your risk for type 2 diabetes by increasing your physical activity, eating healthy foods, and losing weight as directed.  Talk with your health care provider about your risk for type 2 diabetes. Ask about any blood tests or screening tests that you need to have. This information is not intended to replace advice given to you by your health care provider. Make sure you discuss any questions you have with your health care provider. Document Released: 04/15/2015 Document Revised: 05/30/2015 Document Reviewed: 02/12/2015 Elsevier Interactive Patient Education  2018 [place]. Panosh M.D.

## 2017-09-13 ENCOUNTER — Ambulatory Visit (INDEPENDENT_AMBULATORY_CARE_PROVIDER_SITE_OTHER): Payer: 59 | Admitting: Internal Medicine

## 2017-09-13 ENCOUNTER — Encounter: Payer: Self-pay | Admitting: Internal Medicine

## 2017-09-13 VITALS — BP 102/72 | HR 62 | Temp 98.2°F | Ht 62.0 in | Wt 140.3 lb

## 2017-09-13 DIAGNOSIS — Z0000 Encounter for general adult medical examination without abnormal findings: Secondary | ICD-10-CM

## 2017-09-13 DIAGNOSIS — Z0184 Encounter for antibody response examination: Secondary | ICD-10-CM

## 2017-09-13 DIAGNOSIS — N912 Amenorrhea, unspecified: Secondary | ICD-10-CM

## 2017-09-13 DIAGNOSIS — Z114 Encounter for screening for human immunodeficiency virus [HIV]: Secondary | ICD-10-CM

## 2017-09-13 DIAGNOSIS — Z833 Family history of diabetes mellitus: Secondary | ICD-10-CM | POA: Diagnosis not present

## 2017-09-13 DIAGNOSIS — E786 Lipoprotein deficiency: Secondary | ICD-10-CM | POA: Diagnosis not present

## 2017-09-13 DIAGNOSIS — Z23 Encounter for immunization: Secondary | ICD-10-CM | POA: Diagnosis not present

## 2017-09-13 DIAGNOSIS — R739 Hyperglycemia, unspecified: Secondary | ICD-10-CM

## 2017-09-13 LAB — LIPID PANEL
CHOLESTEROL: 149 mg/dL (ref 0–200)
HDL: 40.5 mg/dL (ref >39.00)
LDL CALC: 81 mg/dL (ref 0–99)
NonHDL: 108.42
Total CHOL/HDL Ratio: 4
Triglycerides: 138 mg/dL (ref 0.0–149.0)
VLDL: 27.6 mg/dL (ref 0.0–40.0)

## 2017-09-13 LAB — GLUCOSE, RANDOM: Glucose, Bld: 87 mg/dL (ref 70–99)

## 2017-09-13 LAB — FOLLICLE STIMULATING HORMONE: FSH: 14.5 m[IU]/mL

## 2017-09-13 NOTE — Patient Instructions (Addendum)
Avoid processed carbohydrates and sugars including rice .  You should get  Colonoscopy at age 48 years .   Fu yearly or 6 mos  depending on blood sugars   Health Maintenance, Female Adopting a healthy lifestyle and getting preventive care can go a long way to promote health and wellness. Talk with your health care provider about what schedule of regular examinations is right for you. This is a good chance for you to check in with your provider about disease prevention and staying healthy. In between checkups, there are plenty of things you can do on your own. Experts have done a lot of research about which lifestyle changes and preventive measures are most likely to keep you healthy. Ask your health care provider for more information. Weight and diet Eat a healthy diet  Be sure to include plenty of vegetables, fruits, low-fat dairy products, and lean protein.  Do not eat a lot of foods high in solid fats, added sugars, or salt.  Get regular exercise. This is one of the most important things you can do for your health. ? Most adults should exercise for at least 150 minutes each week. The exercise should increase your heart rate and make you sweat (moderate-intensity exercise). ? Most adults should also do strengthening exercises at least twice a week. This is in addition to the moderate-intensity exercise.  Maintain a healthy weight  Body mass index (BMI) is a measurement that can be used to identify possible weight problems. It estimates body fat based on height and weight. Your health care provider can help determine your BMI and help you achieve or maintain a healthy weight.  For females 73 years of age and older: ? A BMI below 18.5 is considered underweight. ? A BMI of 18.5 to 24.9 is normal. ? A BMI of 25 to 29.9 is considered overweight. ? A BMI of 30 and above is considered obese.  Watch levels of cholesterol and blood lipids  You should start having your blood tested for  lipids and cholesterol at 48 years of age, then have this test every 5 years.  You may need to have your cholesterol levels checked more often if: ? Your lipid or cholesterol levels are high. ? You are older than 48 years of age. ? You are at high risk for heart disease.  Cancer screening Lung Cancer  Lung cancer screening is recommended for adults 43-72 years old who are at high risk for lung cancer because of a history of smoking.  A yearly low-dose CT scan of the lungs is recommended for people who: ? Currently smoke. ? Have quit within the past 15 years. ? Have at least a 30-pack-year history of smoking. A pack year is smoking an average of one pack of cigarettes a day for 1 year.  Yearly screening should continue until it has been 15 years since you quit.  Yearly screening should stop if you develop a health problem that would prevent you from having lung cancer treatment.  Breast Cancer  Practice breast self-awareness. This means understanding how your breasts normally appear and feel.  It also means doing regular breast self-exams. Let your health care provider know about any changes, no matter how small.  If you are in your 20s or 30s, you should have a clinical breast exam (CBE) by a health care provider every 1-3 years as part of a regular health exam.  If you are 4 or older, have a CBE every year. Also consider  having a breast X-ray (mammogram) every year.  If you have a family history of breast cancer, talk to your health care provider about genetic screening.  If you are at high risk for breast cancer, talk to your health care provider about having an MRI and a mammogram every year.  Breast cancer gene (BRCA) assessment is recommended for women who have family members with BRCA-related cancers. BRCA-related cancers include: ? Breast. ? Ovarian. ? Tubal. ? Peritoneal cancers.  Results of the assessment will determine the need for genetic counseling and BRCA1 and  BRCA2 testing.  Cervical Cancer Your health care provider may recommend that you be screened regularly for cancer of the pelvic organs (ovaries, uterus, and vagina). This screening involves a pelvic examination, including checking for microscopic changes to the surface of your cervix (Pap test). You may be encouraged to have this screening done every 3 years, beginning at age 21.  For women ages 30-65, health care providers may recommend pelvic exams and Pap testing every 3 years, or they may recommend the Pap and pelvic exam, combined with testing for human papilloma virus (HPV), every 5 years. Some types of HPV increase your risk of cervical cancer. Testing for HPV may also be done on women of any age with unclear Pap test results.  Other health care providers may not recommend any screening for nonpregnant women who are considered low risk for pelvic cancer and who do not have symptoms. Ask your health care provider if a screening pelvic exam is right for you.  If you have had past treatment for cervical cancer or a condition that could lead to cancer, you need Pap tests and screening for cancer for at least 20 years after your treatment. If Pap tests have been discontinued, your risk factors (such as having a new sexual partner) need to be reassessed to determine if screening should resume. Some women have medical problems that increase the chance of getting cervical cancer. In these cases, your health care provider may recommend more frequent screening and Pap tests.  Colorectal Cancer  This type of cancer can be detected and often prevented.  Routine colorectal cancer screening usually begins at 48 years of age and continues through 48 years of age.  Your health care provider may recommend screening at an earlier age if you have risk factors for colon cancer.  Your health care provider may also recommend using home test kits to check for hidden blood in the stool.  A small camera at the  end of a tube can be used to examine your colon directly (sigmoidoscopy or colonoscopy). This is done to check for the earliest forms of colorectal cancer.  Routine screening usually begins at age 50.  Direct examination of the colon should be repeated every 5-10 years through 48 years of age. However, you may need to be screened more often if early forms of precancerous polyps or small growths are found.  Skin Cancer  Check your skin from head to toe regularly.  Tell your health care provider about any new moles or changes in moles, especially if there is a change in a mole's shape or color.  Also tell your health care provider if you have a mole that is larger than the size of a pencil eraser.  Always use sunscreen. Apply sunscreen liberally and repeatedly throughout the day.  Protect yourself by wearing long sleeves, pants, a wide-brimmed hat, and sunglasses whenever you are outside.  Heart disease, diabetes, and high blood pressure    High blood pressure causes heart disease and increases the risk of stroke. High blood pressure is more likely to develop in: ? People who have blood pressure in the high end of the normal range (130-139/85-89 mm Hg). ? People who are overweight or obese. ? People who are African American.  If you are 32-81 years of age, have your blood pressure checked every 3-5 years. If you are 37 years of age or older, have your blood pressure checked every year. You should have your blood pressure measured twice-once when you are at a hospital or clinic, and once when you are not at a hospital or clinic. Record the average of the two measurements. To check your blood pressure when you are not at a hospital or clinic, you can use: ? An automated blood pressure machine at a pharmacy. ? A home blood pressure monitor.  If you are between 27 years and 26 years old, ask your health care provider if you should take aspirin to prevent strokes.  Have regular diabetes  screenings. This involves taking a blood sample to check your fasting blood sugar level. ? If you are at a normal weight and have a low risk for diabetes, have this test once every three years after 48 years of age. ? If you are overweight and have a high risk for diabetes, consider being tested at a younger age or more often. Preventing infection Hepatitis B  If you have a higher risk for hepatitis B, you should be screened for this virus. You are considered at high risk for hepatitis B if: ? You were born in a country where hepatitis B is common. Ask your health care provider which countries are considered high risk. ? Your parents were born in a high-risk country, and you have not been immunized against hepatitis B (hepatitis B vaccine). ? You have HIV or AIDS. ? You use needles to inject street drugs. ? You live with someone who has hepatitis B. ? You have had sex with someone who has hepatitis B. ? You get hemodialysis treatment. ? You take certain medicines for conditions, including cancer, organ transplantation, and autoimmune conditions.  Hepatitis C  Blood testing is recommended for: ? Everyone born from [birth year] through 1966. ? Anyone with known risk factors for hepatitis C.  Sexually transmitted infections (STIs)  You should be screened for sexually transmitted infections (STIs) including gonorrhea and chlamydia if: ? You are sexually active and are younger than 48 years of age. ? You are older than 48 years of age and your health care provider tells you that you are at risk for this type of infection. ? Your sexual activity has changed since you were last screened and you are at an increased risk for chlamydia or gonorrhea. Ask your health care provider if you are at risk.  If you do not have HIV, but are at risk, it may be recommended that you take a prescription medicine daily to prevent HIV infection. This is called pre-exposure prophylaxis (PrEP). You are considered at risk  if: ? You are sexually active and do not regularly use condoms or know the HIV status of your partner(s). ? You take drugs by injection. ? You are sexually active with a partner who has HIV.  Talk with your health care provider about whether you are at high risk of being infected with HIV. If you choose to begin PrEP, you should first be tested for HIV. You should then be tested every 3 months  for as long as you are taking PrEP. Pregnancy  If you are premenopausal and you may become pregnant, ask your health care provider about preconception counseling.  If you may become pregnant, take 400 to 800 micrograms (mcg) of folic acid every day.  If you want to prevent pregnancy, talk to your health care provider about birth control (contraception). Osteoporosis and menopause  Osteoporosis is a disease in which the bones lose minerals and strength with aging. This can result in serious bone fractures. Your risk for osteoporosis can be identified using a bone density scan.  If you are 32 years of age or older, or if you are at risk for osteoporosis and fractures, ask your health care provider if you should be screened.  Ask your health care provider whether you should take a calcium or vitamin D supplement to lower your risk for osteoporosis.  Menopause may have certain physical symptoms and risks.  Hormone replacement therapy may reduce some of these symptoms and risks. Talk to your health care provider about whether hormone replacement therapy is right for you. Follow these instructions at home:  Schedule regular health, dental, and eye exams.  Stay current with your immunizations.  Do not use any tobacco products including cigarettes, chewing tobacco, or electronic cigarettes.  If you are pregnant, do not drink alcohol.  If you are breastfeeding, limit how much and how often you drink alcohol.  Limit alcohol intake to no more than 1 drink per day for nonpregnant women. One drink  equals 12 ounces of beer, 5 ounces of wine, or 1 ounces of hard liquor.  Do not use street drugs.  Do not share needles.  Ask your health care provider for help if you need support or information about quitting drugs.  Tell your health care provider if you often feel depressed.  Tell your health care provider if you have ever been abused or do not feel safe at home. This information is not intended to replace advice given to you by your health care provider. Make sure you discuss any questions you have with your health care provider. Document Released: 07/07/2010 Document Revised: 05/30/2015 Document Reviewed: 09/25/2014 Elsevier Interactive Patient Education  2018 [place] Maintenance, Female Adopting a healthy lifestyle and getting preventive care can go a long way to promote health and wellness. Talk with your health care provider about what schedule of regular examinations is right for you. This is a good chance for you to check in with your provider about disease prevention and staying healthy. In between checkups, there are plenty of things you can do on your own. Experts have done a lot of research about which lifestyle changes and preventive measures are most likely to keep you healthy. Ask your health care provider for more information. Weight and diet Eat a healthy diet  Be sure to include plenty of vegetables, fruits, low-fat dairy products, and lean protein.  Do not eat a lot of foods high in solid fats, added sugars, or salt.  Get regular exercise. This is one of the most important things you can do for your health. ? Most adults should exercise for at least 150 minutes each week. The exercise should increase your heart rate and make you sweat (moderate-intensity exercise). ? Most adults should also do strengthening exercises at least twice a week. This is in addition to the moderate-intensity exercise.  Maintain a healthy weight  Body mass index (BMI) is  a measurement that can be used  to identify possible weight problems. It estimates body fat based on height and weight. Your health care provider can help determine your BMI and help you achieve or maintain a healthy weight.  For females 41 years of age and older: ? A BMI below 18.5 is considered underweight. ? A BMI of 18.5 to 24.9 is normal. ? A BMI of 25 to 29.9 is considered overweight. ? A BMI of 30 and above is considered obese.  Watch levels of cholesterol and blood lipids  You should start having your blood tested for lipids and cholesterol at 48 years of age, then have this test every 5 years.  You may need to have your cholesterol levels checked more often if: ? Your lipid or cholesterol levels are high. ? You are older than 48 years of age. ? You are at high risk for heart disease.  Cancer screening Lung Cancer  Lung cancer screening is recommended for adults 37-45 years old who are at high risk for lung cancer because of a history of smoking.  A yearly low-dose CT scan of the lungs is recommended for people who: ? Currently smoke. ? Have quit within the past 15 years. ? Have at least a 30-pack-year history of smoking. A pack year is smoking an average of one pack of cigarettes a day for 1 year.  Yearly screening should continue until it has been 15 years since you quit.  Yearly screening should stop if you develop a health problem that would prevent you from having lung cancer treatment.  Breast Cancer  Practice breast self-awareness. This means understanding how your breasts normally appear and feel.  It also means doing regular breast self-exams. Let your health care provider know about any changes, no matter how small.  If you are in your 20s or 30s, you should have a clinical breast exam (CBE) by a health care provider every 1-3 years as part of a regular health exam.  If you are 39 or older, have a CBE every year. Also consider having a breast X-ray (mammogram)  every year.  If you have a family history of breast cancer, talk to your health care provider about genetic screening.  If you are at high risk for breast cancer, talk to your health care provider about having an MRI and a mammogram every year.  Breast cancer gene (BRCA) assessment is recommended for women who have family members with BRCA-related cancers. BRCA-related cancers include: ? Breast. ? Ovarian. ? Tubal. ? Peritoneal cancers.  Results of the assessment will determine the need for genetic counseling and BRCA1 and BRCA2 testing.  Cervical Cancer Your health care provider may recommend that you be screened regularly for cancer of the pelvic organs (ovaries, uterus, and vagina). This screening involves a pelvic examination, including checking for microscopic changes to the surface of your cervix (Pap test). You may be encouraged to have this screening done every 3 years, beginning at age 73.  For women ages 46-65, health care providers may recommend pelvic exams and Pap testing every 3 years, or they may recommend the Pap and pelvic exam, combined with testing for human papilloma virus (HPV), every 5 years. Some types of HPV increase your risk of cervical cancer. Testing for HPV may also be done on women of any age with unclear Pap test results.  Other health care providers may not recommend any screening for nonpregnant women who are considered low risk for pelvic cancer and who do not have symptoms. Ask your health  care provider if a screening pelvic exam is right for you.  If you have had past treatment for cervical cancer or a condition that could lead to cancer, you need Pap tests and screening for cancer for at least 20 years after your treatment. If Pap tests have been discontinued, your risk factors (such as having a new sexual partner) need to be reassessed to determine if screening should resume. Some women have medical problems that increase the chance of getting cervical  cancer. In these cases, your health care provider may recommend more frequent screening and Pap tests.  Colorectal Cancer  This type of cancer can be detected and often prevented.  Routine colorectal cancer screening usually begins at 48 years of age and continues through 48 years of age.  Your health care provider may recommend screening at an earlier age if you have risk factors for colon cancer.  Your health care provider may also recommend using home test kits to check for hidden blood in the stool.  A small camera at the end of a tube can be used to examine your colon directly (sigmoidoscopy or colonoscopy). This is done to check for the earliest forms of colorectal cancer.  Routine screening usually begins at age 81.  Direct examination of the colon should be repeated every 5-10 years through 48 years of age. However, you may need to be screened more often if early forms of precancerous polyps or small growths are found.  Skin Cancer  Check your skin from head to toe regularly.  Tell your health care provider about any new moles or changes in moles, especially if there is a change in a mole's shape or color.  Also tell your health care provider if you have a mole that is larger than the size of a pencil eraser.  Always use sunscreen. Apply sunscreen liberally and repeatedly throughout the day.  Protect yourself by wearing long sleeves, pants, a wide-brimmed hat, and sunglasses whenever you are outside.  Heart disease, diabetes, and high blood pressure  High blood pressure causes heart disease and increases the risk of stroke. High blood pressure is more likely to develop in: ? People who have blood pressure in the high end of the normal range (130-139/85-89 mm Hg). ? People who are overweight or obese. ? People who are African American.  If you are 19-5 years of age, have your blood pressure checked every 3-5 years. If you are 4 years of age or older, have your blood  pressure checked every year. You should have your blood pressure measured twice-once when you are at a hospital or clinic, and once when you are not at a hospital or clinic. Record the average of the two measurements. To check your blood pressure when you are not at a hospital or clinic, you can use: ? An automated blood pressure machine at a pharmacy. ? A home blood pressure monitor.  If you are between 58 years and 75 years old, ask your health care provider if you should take aspirin to prevent strokes.  Have regular diabetes screenings. This involves taking a blood sample to check your fasting blood sugar level. ? If you are at a normal weight and have a low risk for diabetes, have this test once every three years after 48 years of age. ? If you are overweight and have a high risk for diabetes, consider being tested at a younger age or more often. Preventing infection Hepatitis B  If you have a higher  risk for hepatitis B, you should be screened for this virus. You are considered at high risk for hepatitis B if: ? You were born in a country where hepatitis B is common. Ask your health care provider which countries are considered high risk. ? Your parents were born in a high-risk country, and you have not been immunized against hepatitis B (hepatitis B vaccine). ? You have HIV or AIDS. ? You use needles to inject street drugs. ? You live with someone who has hepatitis B. ? You have had sex with someone who has hepatitis B. ? You get hemodialysis treatment. ? You take certain medicines for conditions, including cancer, organ transplantation, and autoimmune conditions.  Hepatitis C  Blood testing is recommended for: ? Everyone born from [birth year] through 1966. ? Anyone with known risk factors for hepatitis C.  Sexually transmitted infections (STIs)  You should be screened for sexually transmitted infections (STIs) including gonorrhea and chlamydia if: ? You are sexually active and are  younger than 48 years of age. ? You are older than 48 years of age and your health care provider tells you that you are at risk for this type of infection. ? Your sexual activity has changed since you were last screened and you are at an increased risk for chlamydia or gonorrhea. Ask your health care provider if you are at risk.  If you do not have HIV, but are at risk, it may be recommended that you take a prescription medicine daily to prevent HIV infection. This is called pre-exposure prophylaxis (PrEP). You are considered at risk if: ? You are sexually active and do not regularly use condoms or know the HIV status of your partner(s). ? You take drugs by injection. ? You are sexually active with a partner who has HIV.  Talk with your health care provider about whether you are at high risk of being infected with HIV. If you choose to begin PrEP, you should first be tested for HIV. You should then be tested every 3 months for as long as you are taking PrEP. Pregnancy  If you are premenopausal and you may become pregnant, ask your health care provider about preconception counseling.  If you may become pregnant, take 400 to 800 micrograms (mcg) of folic acid every day.  If you want to prevent pregnancy, talk to your health care provider about birth control (contraception). Osteoporosis and menopause  Osteoporosis is a disease in which the bones lose minerals and strength with aging. This can result in serious bone fractures. Your risk for osteoporosis can be identified using a bone density scan.  If you are 57 years of age or older, or if you are at risk for osteoporosis and fractures, ask your health care provider if you should be screened.  Ask your health care provider whether you should take a calcium or vitamin D supplement to lower your risk for osteoporosis.  Menopause may have certain physical symptoms and risks.  Hormone replacement therapy may reduce some of these symptoms and  risks. Talk to your health care provider about whether hormone replacement therapy is right for you. Follow these instructions at home:  Schedule regular health, dental, and eye exams.  Stay current with your immunizations.  Do not use any tobacco products including cigarettes, chewing tobacco, or electronic cigarettes.  If you are pregnant, do not drink alcohol.  If you are breastfeeding, limit how much and how often you drink alcohol.  Limit alcohol intake to no more than  1 drink per day for nonpregnant women. One drink equals 12 ounces of beer, 5 ounces of wine, or 1 ounces of hard liquor.  Do not use street drugs.  Do not share needles.  Ask your health care provider for help if you need support or information about quitting drugs.  Tell your health care provider if you often feel depressed.  Tell your health care provider if you have ever been abused or do not feel safe at home. This information is not intended to replace advice given to you by your health care provider. Make sure you discuss any questions you have with your health care provider. Document Released: 07/07/2010 Document Revised: 05/30/2015 Document Reviewed: 09/25/2014 Elsevier Interactive Patient Education  2018 [employer].    Preventing Type 2 Diabetes Mellitus Type 2 diabetes (type 2 diabetes mellitus) is a long-term (chronic) disease that affects blood sugar (glucose) levels. Normally, a hormone called insulin allows glucose to enter cells in the body. The cells use glucose for energy. In type 2 diabetes, one or both of these problems may be present:  The body does not make enough insulin.  The body does not respond properly to insulin that it makes (insulin resistance).  Insulin resistance or lack of insulin causes excess glucose to build up in the blood instead of going into cells. As a result, high blood glucose (hyperglycemia) develops, which can cause many complications. Being overweight or obese  and having an inactive (sedentary) lifestyle can increase your risk for diabetes. Type 2 diabetes can be delayed or prevented by making certain nutrition and lifestyle changes. What nutrition changes can be made?  Eat healthy meals and snacks regularly. Keep a healthy snack with you for when you get hungry between meals, such as fruit or a handful of nuts.  Eat lean meats and proteins that are low in saturated fats, such as chicken, fish, egg whites, and beans. Avoid processed meats.  Eat plenty of fruits and vegetables and plenty of grains that have not been processed (whole grains). It is recommended that you eat: ? 1?2 cups of fruit every day. ? 2?3 cups of vegetables every day. ? 6?8 oz of whole grains every day, such as oats, whole wheat, bulgur, brown rice, quinoa, and millet.  Eat low-fat dairy products, such as milk, yogurt, and cheese.  Eat foods that contain healthy fats, such as nuts, avocado, olive oil, and canola oil.  Drink water throughout the day. Avoid drinks that contain added sugar, such as soda or sweet tea.  Follow instructions from your health care provider about specific eating or drinking restrictions.  Control how much food you eat at a time (portion size). ? Check food labels to find out the serving sizes of foods. ? Use a kitchen scale to weigh amounts of foods.  Saute or steam food instead of frying it. Cook with water or broth instead of oils or butter.  Limit your intake of: ? Salt (sodium). Have no more than 1 tsp (2,400 mg) of sodium a day. If you have heart disease or high blood pressure, have less than ? tsp (1,500 mg) of sodium a day. ? Saturated fat. This is fat that is solid at room temperature, such as butter or fat on meat. What lifestyle changes can be made?  Activity  Do moderate-intensity physical activity for at least 30 minutes on at least 5 days of the week, or as much as told by your health care provider.  Ask your health care  provider what activities are safe for you. A mix of physical activities may be best, such as walking, swimming, cycling, and strength training.  Try to add physical activity into your day. For example: ? Park in spots that are farther away than usual, so that you walk more. For example, park in a far corner of the parking lot when you go to the office or the grocery store. ? Take a walk during your lunch break. ? Use stairs instead of elevators or escalators. Weight Loss  Lose weight as directed. Your health care provider can determine how much weight loss is best for you and can help you lose weight safely.  If you are overweight or obese, you may be instructed to lose at least 5?7 % of your body weight. Alcohol and Tobacco   Limit alcohol intake to no more than 1 drink a day for nonpregnant women and 2 drinks a day for men. One drink equals 12 oz of beer, 5 oz of wine, or 1 oz of hard liquor.  Do not use any tobacco products, such as cigarettes, chewing tobacco, and e-cigarettes. If you need help quitting, ask your health care provider. Work With [place] Provider  Have your blood glucose tested regularly, as told by your health care provider.  Discuss your risk factors and how you can reduce your risk for diabetes.  Get screening tests as told by your health care provider. You may have screening tests regularly, especially if you have certain risk factors for type 2 diabetes.  Make an appointment with a diet and nutrition specialist (registered dietitian). A registered dietitian can help you make a healthy eating plan and can help you understand portion sizes and food labels. Why are these changes important?  It is possible to prevent or delay type 2 diabetes and related health problems by making lifestyle and nutrition changes.  It can be difficult to recognize signs of type 2 diabetes. The best way to avoid possible damage to your body is to take actions to prevent the  disease before you develop symptoms. What can happen if changes are not made?  Your blood glucose levels may keep increasing. Having high blood glucose for a long time is dangerous. Too much glucose in your blood can damage your blood vessels, heart, kidneys, nerves, and eyes.  You may develop prediabetes or type 2 diabetes. Type 2 diabetes can lead to many chronic health problems and complications, such as: ? Heart disease. ? Stroke. ? Blindness. ? Kidney disease. ? Depression. ? Poor circulation in the feet and legs, which could lead to surgical removal (amputation) in severe cases. Where to find support:  Ask your health care provider to recommend a registered dietitian, diabetes educator, or weight loss program.  Look for local or online weight loss groups.  Join a gym, fitness club, or outdoor activity group, such as a walking club. Where to find more information: To learn more about diabetes and diabetes prevention, visit:  American Diabetes Association (ADA): www.diabetes.[employer] of Diabetes and Digestive and Kidney Diseases: FindSpin.nl  To learn more about healthy eating, visit:  The U.S. Department of Agriculture Scientist, research (physical sciences)), Choose My Plate: http://wiley-williams.com/  Office of Disease Prevention and Health Promotion (ODPHP), Dietary Guidelines: SurferLive.at  Summary  You can reduce your risk for type 2 diabetes by increasing your physical activity, eating healthy foods, and losing weight as directed.  Talk with your health care provider about your risk for type 2 diabetes. Ask  about any blood tests or screening tests that you need to have. This information is not intended to replace advice given to you by your health care provider. Make sure you discuss any questions you have with your health care provider. Document Released: 04/15/2015 Document Revised: 05/30/2015 Document Reviewed:  02/12/2015 Elsevier Interactive Patient Education  [employer].

## 2017-09-14 DIAGNOSIS — Z1231 Encounter for screening mammogram for malignant neoplasm of breast: Secondary | ICD-10-CM | POA: Diagnosis not present

## 2017-09-14 LAB — HEPATITIS B SURFACE ANTIBODY,QUALITATIVE: HEP B S AB: NONREACTIVE

## 2017-09-14 LAB — HEPATITIS B SURFACE ANTIGEN
CONFIRMATION: REACTIVE — AB
HEP B S AG: REACTIVE — AB

## 2017-09-14 LAB — HIV ANTIBODY (ROUTINE TESTING W REFLEX): HIV 1&2 Ab, 4th Generation: NONREACTIVE

## 2017-09-14 LAB — HM MAMMOGRAPHY

## 2017-09-15 ENCOUNTER — Other Ambulatory Visit: Payer: Self-pay | Admitting: Internal Medicine

## 2017-09-15 DIAGNOSIS — R768 Other specified abnormal immunological findings in serum: Secondary | ICD-10-CM

## 2017-09-15 NOTE — Progress Notes (Unsigned)
See  Result note  future labs ordered

## 2017-09-17 ENCOUNTER — Other Ambulatory Visit (INDEPENDENT_AMBULATORY_CARE_PROVIDER_SITE_OTHER): Payer: 59

## 2017-09-17 DIAGNOSIS — R768 Other specified abnormal immunological findings in serum: Secondary | ICD-10-CM

## 2017-09-17 LAB — HEPATIC FUNCTION PANEL
ALK PHOS: 55 U/L (ref 39–117)
ALT: 11 U/L (ref 0–35)
AST: 15 U/L (ref 0–37)
Albumin: 4.2 g/dL (ref 3.5–5.2)
BILIRUBIN DIRECT: 0.1 mg/dL (ref 0.0–0.3)
TOTAL PROTEIN: 7.2 g/dL (ref 6.0–8.3)
Total Bilirubin: 0.8 mg/dL (ref 0.2–1.2)

## 2017-09-22 LAB — HEPATITIS B E ANTIGEN: HEP B E AG: NONREACTIVE

## 2017-09-22 LAB — HEPATITIS B CORE ANTIBODY, TOTAL: HEP B C TOTAL AB: REACTIVE — AB

## 2017-09-22 LAB — HEPATITIS C ANTIBODY
Hepatitis C Ab: NONREACTIVE
SIGNAL TO CUT-OFF: 0.05 (ref <1.00)

## 2017-09-22 LAB — HEPATITIS B DNA, ULTRAQUANTITATIVE, PCR
HEPATITIS B DNA (CALC): 4.26 {Log_IU}/mL — AB
Hepatitis B DNA: 18000 IU/mL — ABNORMAL HIGH

## 2017-09-23 ENCOUNTER — Other Ambulatory Visit: Payer: Self-pay | Admitting: Internal Medicine

## 2017-09-23 DIAGNOSIS — B1910 Unspecified viral hepatitis B without hepatic coma: Secondary | ICD-10-CM

## 2017-09-24 ENCOUNTER — Other Ambulatory Visit: Payer: Self-pay | Admitting: Internal Medicine

## 2017-09-24 DIAGNOSIS — R768 Other specified abnormal immunological findings in serum: Secondary | ICD-10-CM

## 2017-09-24 DIAGNOSIS — B1910 Unspecified viral hepatitis B without hepatic coma: Secondary | ICD-10-CM

## 2017-09-24 NOTE — Progress Notes (Signed)
Chief Complaint  Patient presents with  . Follow-up    Discuss Hep B    HPI: Janet Nichols 47 y.o. come in for follow-up of abnormal lab test positive hepatitis B antigen negative antibody.  She has had no symptoms was born in [place] but is [place] does not remember she had immunizations but had no history of liver disease and family history is negative. She has not donated blood so has not been tested before. She is under evaluation for hyperpigmented spots on her lower extremities.   ROS: See pertinent positives and negatives per HPI.  Past Medical History:  Diagnosis Date  . Acne   . Gestational diabetes   . IC (interstitial cystitis)   . Low HDL (under 40)   . Rosacea     Family History  Problem Relation Age of Onset  . Hypertension Mother   . Diabetes Mother   . Hypertension Sister   . Breast cancer Cousin   . Kidney failure Mother        mom on dialysis has dn    Social History   Socioeconomic History  . Marital status: Married    Spouse name: Not on file  . Number of children: Not on file  . Years of education: Not on file  . Highest education level: Not on file  Occupational History  . Not on file  Social Needs  . Financial resource strain: Not on file  . Food insecurity:    Worry: Not on file    Inability: Not on file  . Transportation needs:    Medical: Not on file    Non-medical: Not on file  Tobacco Use  . Smoking status: Never Smoker  . Smokeless tobacco: Never Used  Substance and Sexual Activity  . Alcohol use: No    Alcohol/week: 0.0 standard drinks  . Drug use: No  . Sexual activity: Not on file  Lifestyle  . Physical activity:    Days per week: Not on file    Minutes per session: Not on file  . Stress: Not on file  Relationships  . Social connections:    Talks on phone: Not on file    Gets together: Not on file    Attends religious service: Not on file    Active member of club or organization: Not on  file    Attends meetings of clubs or organizations: Not on file    Relationship status: Not on file  Other Topics Concern  . Not on file  Social History Narrative   Married   HH of 4       No pets   Sleep 7 hours   40 hours at least 10 hours days mon th friday   Exercise sometimes             Outpatient Medications Prior to Visit  Medication Sig Dispense Refill  . augmented betamethasone dipropionate (DIPROLENE-AF) 0.05 % cream APPLY CREAM TOPICALLY TO AFFECTED AREA UP TO TWICE DAILY AS NEEDED. DO NOT USE ON FACE GROIN UNDERARMS.  3  . meloxicam (MOBIC) 7.5 MG tablet Take 1 tablet (7.5 mg total) by mouth daily. (Patient not taking: Reported on 09/27/2017) 30 tablet 0   No facility-administered medications prior to visit.      EXAM:  BP (!) 142/86 (BP Location: Right Arm, Patient Position: Sitting, Cuff Size: Normal)   Pulse 61   Temp 97.9 F (36.6 C) (Oral)   Wt  138 lb 3.2 oz (62.7 kg)   BMI 25.28 kg/m   Body mass index is 25.28 kg/m.  GENERAL: vitals reviewed and listed above, alert, oriented, appears well hydrated and in no acute distress PSYCH: pleasant and cooperative, no obvious depression or anxiety Lab Results  Component Value Date   WBC 7.4 08/03/2017   HGB 14.2 08/03/2017   HCT 41.9 08/03/2017   PLT 223.0 08/03/2017   GLUCOSE 87 09/13/2017   CHOL 149 09/13/2017   TRIG 138.0 09/13/2017   HDL 40.50 09/13/2017   LDLCALC 81 09/13/2017   ALT 11 09/17/2017   AST 15 09/17/2017   NA 141 08/03/2017   K 3.4 (L) 08/03/2017   CL 104 08/03/2017   CREATININE 0.70 08/03/2017   BUN 14 08/03/2017   CO2 29 08/03/2017   TSH 0.52 08/26/2017   HGBA1C 6.2 08/03/2017   BP Readings from Last 3 Encounters:  09/27/17 (!) 142/86  09/13/17 102/72  08/03/17 120/64   Hepatitis B DNA IU/mL 18,000High    Hepatitis B DNA (Calc) Log IU/mL 4.57QION    Comment: .    Hep B E Ag NON-REACTI NON-REACTIVE   Resulting Agency  [facility]   Hepatitis B DNA IU/mL 18,000High      Hepatitis B DNA (Calc) Log IU/mL 4.26High    Comment: .  Reference Range:              Not Detected    IU/mL              Not Detected  Log IU/mL  .  This test was performed using Real-Time Polymerase  Chain Reaction.  .  Reportable range is 10 IU/mL to 1,000,000,000 IU/mL  (1.00 Log IU/mL - 9.00 Log IU/mL).      ASSESSMENT AND PLAN:  Discussed the following assessment and plan:  Hepatitis B carrier ([place]) - Plan: Hepatic function panel, Hepatitis delta virus antigen, Hepatitis B DNA, ultraquantitative, PCR  Hepatitis B surface antigen positive - Plan: Hepatic function panel, Hepatitis delta virus antigen, Hepatitis B DNA, ultraquantitative, PCR  Need for hepatitis A immunization - Plan: Hepatitis A vaccine adult IM Blood pressure is up a bit today probably secondary to the stress of the visit. She has hepatitis B- envelope antigen noted with positive elevated viral copies She has no history of hepatitis family history only risk factor is being born in [place]. Review of her previous LFTs show good ranges for the last number of years. Below 25  This value is picked up by screening when patient brought up discussing testing and concerned about cancer risk. Discussed potential infectivity husband should see his primary doctor about results immunizations as appropriate. Hepatitis A vaccine given today her hepatitis C serology was negative.  She asked whether hepatitis Bvaccine would be helpful for her. Am not aware that it would help her since she is already infected .   Her liver ultrasound is pending and after this is back we will plan appropriate follow-up. Either way she should have LFTs repeated in 3 to 4 months. We will review record for other appropriate testing. -Patient advised to return or notify health care team  if  new concerns arise.  Patient Instructions  No evidence of liver inflammation and damage  So far  But you are a carrier of HEP b    No  Hep c and HIV negative .   Hepatitis vaccine today .   Suppose it could have been exposed at country of origin .  Plan repeat lab in 3 months  Await liver ultrasound.    Usually no meds if not active but we can get a consult from infection liver specialist as needed.   Husband  Needs to see his  Provider  As we discussed  . He may be immunie  And if not should have vaccination.    Hepatitis B Antigen and Antibody Tests Why am I having this test? Testing for hepatitis B virus (HBV) can include checking your blood for infection by the virus (antigen) itself or for hepatitis B antibodies. Hepatitis B antibodies are infection-fighting proteins produced by your body's defense (immune) system in response to an infection with HBV. These antibodies are also produced after you have received the hepatitis B vaccine. Different types of HBV antibodies may be present in your blood, depending on how long it has been since infection or vaccination occurred. Different types of HBV antigens can also be detected. The hepatitis B antigen and antibody tests detect the presence of these antigens or antibodies. These tests may be done:  If there is concern that you have been exposed to hepatitis B.  To diagnose hepatitis B infection.  If you have long-term (chronic) liver disease or abnormal liver function test results. This test may help to find the cause.  To see if you are already protected from (immune to) hepatitis B.  To see if you need the hepatitis B vaccine to protect you from future infection.  What kind of sample is taken? A blood sample is required for the hepatitis B antigen and antibody tests. It is usually collected by inserting a needle into a vein. How do I prepare for this test? There is no preparation required for this test. What do the results mean? It is your responsibility to obtain your test results. Ask the lab or department performing the test when and how you will get  your results. Talk with your health care provider if you have any questions about your results. The test results are based on which antibodies your blood has (is positive for) and which antibodies your blood does not have (is negative for). The test results are also based on whether hepatitis B antigen molecules are found in your blood. Normal Test Results The test result is normal if it matches one of these descriptions:  Negative HBsAg and HBeAg. This means no HBV antigen is present in your blood.  Positive HBsAb. This means you do not have an active infection but that you are protected from HBV from past exposure or from vaccination.  Abnormal Test Results The test result is abnormal and may indicate current or recent HBV infection if it matches one of these descriptions:  Positive HBeAg and HBsAg. This means you have a current HBV infection.  Positive HBVc-Ab/IgM or HBVc-Ab total. This means you have a current, previous, or long-term HBV infection.  Discuss your test results with your health care provider. He or she will use the results to make a diagnosis and determine a treatment plan that is right for you. Talk with your health care provider to discuss your results, treatment options, and if necessary, the need for more tests. Talk with your health care provider if you have any questions about your results. This information is not intended to replace advice given to you by your health care provider. Make sure you discuss any questions you have with your health care provider. Document Released: 01/17/2004 Document Revised: 08/28/2015 Document Reviewed: 05/09/2013 Elsevier Interactive Patient Education  2018 [place] Panosh M.D.

## 2017-09-27 ENCOUNTER — Ambulatory Visit (INDEPENDENT_AMBULATORY_CARE_PROVIDER_SITE_OTHER): Payer: 59 | Admitting: Internal Medicine

## 2017-09-27 ENCOUNTER — Encounter: Payer: Self-pay | Admitting: Internal Medicine

## 2017-09-27 VITALS — BP 142/86 | HR 61 | Temp 97.9°F | Wt 138.2 lb

## 2017-09-27 DIAGNOSIS — L986 Other infiltrative disorders of the skin and subcutaneous tissue: Secondary | ICD-10-CM | POA: Diagnosis not present

## 2017-09-27 DIAGNOSIS — B181 Chronic viral hepatitis B without delta-agent: Secondary | ICD-10-CM | POA: Diagnosis not present

## 2017-09-27 DIAGNOSIS — R768 Other specified abnormal immunological findings in serum: Secondary | ICD-10-CM | POA: Diagnosis not present

## 2017-09-27 DIAGNOSIS — Z23 Encounter for immunization: Secondary | ICD-10-CM | POA: Diagnosis not present

## 2017-09-27 NOTE — Patient Instructions (Signed)
No evidence of liver inflammation and damage  So far  But you are a carrier of HEP b   No  Hep c and HIV negative .   Hepatitis vaccine today .   Suppose it could have been exposed at country of origin .    Plan repeat lab in 3 months  Await liver ultrasound.    Usually no meds if not active but we can get a consult from infection liver specialist as needed.   Husband  Needs to see his  Provider  As we discussed  . He may be immunie  And if not should have vaccination.    Hepatitis B Antigen and Antibody Tests Why am I having this test? Testing for hepatitis B virus (HBV) can include checking your blood for infection by the virus (antigen) itself or for hepatitis B antibodies. Hepatitis B antibodies are infection-fighting proteins produced by your body's defense (immune) system in response to an infection with HBV. These antibodies are also produced after you have received the hepatitis B vaccine. Different types of HBV antibodies may be present in your blood, depending on how long it has been since infection or vaccination occurred. Different types of HBV antigens can also be detected. The hepatitis B antigen and antibody tests detect the presence of these antigens or antibodies. These tests may be done:  If there is concern that you have been exposed to hepatitis B.  To diagnose hepatitis B infection.  If you have long-term (chronic) liver disease or abnormal liver function test results. This test may help to find the cause.  To see if you are already protected from (immune to) hepatitis B.  To see if you need the hepatitis B vaccine to protect you from future infection.  What kind of sample is taken? A blood sample is required for the hepatitis B antigen and antibody tests. It is usually collected by inserting a needle into a vein. How do I prepare for this test? There is no preparation required for this test. What do the results mean? It is your responsibility to obtain your  test results. Ask the lab or department performing the test when and how you will get your results. Talk with your health care provider if you have any questions about your results. The test results are based on which antibodies your blood has (is positive for) and which antibodies your blood does not have (is negative for). The test results are also based on whether hepatitis B antigen molecules are found in your blood. Normal Test Results The test result is normal if it matches one of these descriptions:  Negative HBsAg and HBeAg. This means no HBV antigen is present in your blood.  Positive HBsAb. This means you do not have an active infection but that you are protected from HBV from past exposure or from vaccination.  Abnormal Test Results The test result is abnormal and may indicate current or recent HBV infection if it matches one of these descriptions:  Positive HBeAg and HBsAg. This means you have a current HBV infection.  Positive HBVc-Ab/IgM or HBVc-Ab total. This means you have a current, previous, or long-term HBV infection.  Discuss your test results with your health care provider. He or she will use the results to make a diagnosis and determine a treatment plan that is right for you. Talk with your health care provider to discuss your results, treatment options, and if necessary, the need for more tests. Talk with your health care  provider if you have any questions about your results. This information is not intended to replace advice given to you by your health care provider. Make sure you discuss any questions you have with your health care provider. Document Released: 01/17/2004 Document Revised: 08/28/2015 Document Reviewed: 05/09/2013 Elsevier Interactive Patient Education  2018 [employer].

## 2017-10-11 ENCOUNTER — Encounter: Payer: Self-pay | Admitting: Neurology

## 2017-10-11 ENCOUNTER — Ambulatory Visit (INDEPENDENT_AMBULATORY_CARE_PROVIDER_SITE_OTHER): Payer: 59 | Admitting: Neurology

## 2017-10-11 VITALS — BP 110/76 | HR 60 | Ht 62.0 in | Wt 136.0 lb

## 2017-10-11 DIAGNOSIS — M5417 Radiculopathy, lumbosacral region: Secondary | ICD-10-CM | POA: Diagnosis not present

## 2017-10-11 NOTE — Progress Notes (Signed)
[facility] Clinic Note - Initial Visit   Date: 10/11/17  Janet Nichols MRN: [id number] DOB: 11/28/1970   Dear Dr. Regis Bill:  Thank you for your kind referral of Janet Nichols for consultation of bilateral leg pain. Although her history is well known to you, please allow [name] to reiterate it for the purpose of our medical record. The patient was accompanied to the clinic by self.    History of Present Illness: Janet Nichols is a 47 y.o. right-handed female with hepatitis B presenting for evaluation of bilateral leg numbness/tingling.    Starting around July, she began having numbness and tingling over the legs and especially over the left 4th toe.  She also has associated achy and sharp pain in her back which would radiates into her legs.  Pain was always worse in the morning, making it difficult to get out of bed.  Over the past few weeks, her symptoms have significantly improved and does not occur.  She still has mild numbness of the left 4th toe which is now intermittent.  She did not try heat/ice or NSAIDs.    Out-side paper records, electronic medical record, and images have been reviewed where available and summarized as:  Lab Results  Component Value Date   TSH 0.52 08/26/2017   Lab Results  Component Value Date   VITAMINB12 391 08/03/2017   Lab Results  Component Value Date   HGBA1C 6.2 08/03/2017     Past Medical History:  Diagnosis Date  . Acne   . Gestational diabetes   . IC (interstitial cystitis)   . Low HDL (under 40)   . Rosacea     Past Surgical History:  Procedure Laterality Date  . bladder stretched    . ESSURE TUBAL LIGATION  2011     Medications:  Outpatient Encounter Medications as of 10/11/2017  Medication Sig  . augmented betamethasone dipropionate (DIPROLENE-AF) 0.05 % cream APPLY CREAM TOPICALLY TO AFFECTED AREA UP TO TWICE DAILY AS NEEDED. DO NOT USE ON FACE GROIN UNDERARMS.   No facility-administered encounter  medications on file as of 10/11/2017.      Allergies:  Allergies  Allergen Reactions  . Sulfamethoxazole-Trimethoprim     REACTION: ? rash    Family History: Family History  Problem Relation Age of Onset  . Hypertension Mother   . Diabetes Mother   . Kidney failure Mother        mom on dialysis has dn  . Hypertension Sister   . Breast cancer Cousin     Social History: Social History   Tobacco Use  . Smoking status: Never Smoker  . Smokeless tobacco: Never Used  Substance Use Topics  . Alcohol use: No    Alcohol/week: 0.0 standard drinks  . Drug use: No   Social History   Social History Narrative   Married   HH of 4       No pets   Sleep 7 hours   40 hours at least 10 hours days mon th friday   Exercise sometimes             Review of Systems:  CONSTITUTIONAL: No fevers, chills, night sweats, or weight loss.   EYES: No visual changes or eye pain ENT: No hearing changes.  No history of nose bleeds.   RESPIRATORY: No cough, wheezing and shortness of breath.   CARDIOVASCULAR: Negative for chest pain, and palpitations.   GI: Negative for abdominal discomfort, blood in stools or  black stools.  No recent change in bowel habits.   GU:  No history of incontinence.   MUSCLOSKELETAL: No history of joint pain or swelling.  No myalgias.   SKIN: Negative for lesions, rash, and itching.   HEMATOLOGY/ONCOLOGY: Negative for prolonged bleeding, bruising easily, and swollen nodes.  No history of cancer.   ENDOCRINE: Negative for cold or heat intolerance, polydipsia or goiter.   PSYCH:  No depression or anxiety symptoms.   NEURO: As Above.   Vital Signs:  BP 110/76   Pulse 60   Ht 5\' 2"  (1.575 m)   Wt 136 lb (61.7 kg)   SpO2 97%   BMI 24.87 kg/m    General Medical Exam:   General:  Well appearing, comfortable.   Eyes/ENT: see cranial nerve examination.   Neck: No masses appreciated.  Full range of motion without tenderness.  No carotid bruits. Respiratory:   Clear to auscultation, good air entry bilaterally.   Cardiac:  Regular rate and rhythm, no murmur.   Extremities:  No deformities, edema, or skin discoloration.  Skin:  No rashes or lesions.  Neurological Exam: MENTAL STATUS including orientation to time, place, person, recent and remote memory, attention span and concentration, language, and fund of knowledge is normal.  Speech is not dysarthric.  CRANIAL NERVES: II:  No visual field defects. III-IV-VI: Pupils equal round and reactive to light.  Normal conjugate, extra-ocular eye movements in all directions of gaze.  No nystagmus.  No ptosis.   V:  Normal facial sensation.    VII:  Mild facial asymmetry on the left, facial movements are intact.  III:  Normal hearing and vestibular function.   IX-X:  Normal palatal movement.   XI:  Normal shoulder shrug and head rotation.   XII:  Normal tongue strength and range of motion, no deviation or fasciculation.  MOTOR:  Motor strength is 5/5 throughout. No atrophy, fasciculations or abnormal movements.  No pronator drift.  Tone is normal.    MSRs:  Right                                                                 Left brachioradialis 2+  brachioradialis 2+  biceps 2+  biceps 2+  triceps 2+  triceps 2+  patellar 2+  patellar 2+  ankle jerk 2+  ankle jerk 2+  Hoffman no  Hoffman no  plantar response down  plantar response down   SENSORY:  Normal and symmetric perception of light touch, pinprick, vibration.    COORDINATION/GAIT: Normal finger-to- nose-finger.  Intact rapid alternating movements bilaterally. Gait narrow based and stable. Tandem and stressed gait intact.    IMPRESSION: Lumbosacral radiculopathy manifesting with low back pain and paresthesias radiating in the legs, worse on the left.  Symptoms have self-resolved and no longer bothersome. Her neurological exam is normal, which is reassuring.  Strategies for preventing injury to the back were discussed.  She has mild residual  numbness of the left 4th toe, there is no weakness or absence of reflexes.  If symptoms get worse, recommend starting physical therapy for low back stretching and strengthening.    Return to clinic as needed   Thank you for allowing me to participate in patient's care.  If I can answer any additional  questions, I would be pleased to do so.    Sincerely,    Emilyanne Mcgough K. Posey Pronto, DO

## 2017-10-11 NOTE — Patient Instructions (Addendum)
If your low back pain gets worse, please call my office and we can refer you to physical therapy   Back Injury Prevention Back injuries can be very painful. They can also be difficult to heal. After having one back injury, you are more likely to injure your back again. It is important to learn how to avoid injuring or re-injuring your back. The following tips can help you to prevent a back injury. What should I know about physical fitness?  Exercise for 30 minutes per day on most days of the week or as told by your doctor. Make sure to: ? Do aerobic exercises, such as walking, jogging, biking, or swimming. ? Do exercises that increase balance and strength, such as tai chi and yoga. ? Do stretching exercises. This helps with flexibility. ? Try to develop strong belly (abdominal) muscles. Your belly muscles help to support your back.  Stay at a healthy weight. This helps to decrease your risk of a back injury. What should I know about my diet?  Talk with your doctor about your overall diet. Take supplements and vitamins only as told by your doctor.  Talk with your doctor about how much calcium and vitamin D you need each day. These nutrients help to prevent weakening of the bones (osteoporosis).  Include good sources of calcium in your diet, such as: ? Dairy products. ? Green leafy vegetables. ? Products that have had calcium added to them (fortified).  Include good sources of vitamin D in your diet, such as: ? Milk. ? Foods that have had vitamin D added to them. What should I know about my posture?  Sit up straight and stand up straight. Avoid leaning forward when you sit or hunching over when you stand.  Choose chairs that have good low-back (lumbar) support.  If you work at a desk, sit close to it so you do not need to lean over. Keep your chin tucked in. Keep your neck drawn back. Keep your elbows bent so your arms look like the letter "L" (right angle).  Sit high and close to  the steering wheel when you drive. Add a low-back support to your car seat, if needed.  Avoid sitting or standing in one position for very long. Take breaks to get up, stretch, and walk around at least one time every hour. Take breaks every hour if you are driving for long periods of time.  Sleep on your side with your knees slightly bent, or sleep on your back with a pillow under your knees. Do not lie on the front of your body to sleep. What should I know about lifting, twisting, and reaching? Lifting and Heavy Lifting   Avoid heavy lifting, especially lifting over and over again. If you must do heavy lifting: ? Stretch before lifting. ? Work slowly. ? Rest between lifts. ? Use a tool such as a cart or a dolly to move objects if one is available. ? Make several small trips instead of carrying one heavy load. ? Ask for help when you need it, especially when moving big objects.  Follow these steps when lifting: ? Stand with your feet shoulder-width apart. ? Get as close to the object as you can. Do not pick up a heavy object that is far from your body. ? Use handles or lifting straps if they are available. ? Bend at your knees. Squat down, but keep your heels off the floor. ? Keep your shoulders back. Keep your chin tucked in. Keep  your back straight. ? Lift the object slowly while you tighten the muscles in your legs, belly, and butt. Keep the object as close to the center of your body as possible.  Follow these steps when putting down a heavy load: ? Stand with your feet shoulder-width apart. ? Lower the object slowly while you tighten the muscles in your legs, belly, and butt. Keep the object as close to the center of your body as possible. ? Keep your shoulders back. Keep your chin tucked in. Keep your back straight. ? Bend at your knees. Squat down, but keep your heels off the floor. ? Use handles or lifting straps if they are available. Twisting and Reaching  Avoid lifting  heavy objects above your waist.  Do not twist at your waist while you are lifting or carrying a load. If you need to turn, move your feet.  Do not bend over without bending at your knees.  Avoid reaching over your head, across a table, or for an object on a high surface. What are some other tips?  Avoid wet floors and icy ground. Keep sidewalks clear of ice to prevent falls.  Do not sleep on a mattress that is too soft or too hard.  Keep items that you use often within easy reach.  Put heavier objects on shelves at waist level, and put lighter objects on lower or higher shelves.  Find ways to lower your stress, such as: ? Exercise. ? Massage. ? Relaxation techniques.  Talk with your doctor if you feel anxious or depressed. These conditions can make back pain worse.  Wear flat heel shoes with cushioned soles.  Avoid making quick (sudden) movements.  Use both shoulder straps when carrying a backpack.  Do not use any tobacco products, including cigarettes, chewing tobacco, or electronic cigarettes. If you need help quitting, ask your doctor. This information is not intended to replace advice given to you by your health care provider. Make sure you discuss any questions you have with your health care provider. Document Released: 06/10/2007 Document Revised: 05/30/2015 Document Reviewed: 12/26/2013 Elsevier Interactive Patient Education  [employer].

## 2017-10-15 ENCOUNTER — Ambulatory Visit
Admission: RE | Admit: 2017-10-15 | Discharge: 2017-10-15 | Disposition: A | Discharge status: Home / Self Care | Payer: 59 | Source: Ambulatory Visit | Attending: Internal Medicine | Admitting: Internal Medicine

## 2017-10-15 DIAGNOSIS — K7689 Other specified diseases of liver: Secondary | ICD-10-CM | POA: Diagnosis not present

## 2017-10-15 DIAGNOSIS — B1910 Unspecified viral hepatitis B without hepatic coma: Secondary | ICD-10-CM

## 2017-10-15 DIAGNOSIS — R768 Other specified abnormal immunological findings in serum: Secondary | ICD-10-CM

## 2017-10-25 ENCOUNTER — Encounter: Payer: Self-pay | Admitting: Family Medicine

## 2017-10-25 ENCOUNTER — Ambulatory Visit (INDEPENDENT_AMBULATORY_CARE_PROVIDER_SITE_OTHER): Payer: 59 | Admitting: Family Medicine

## 2017-10-25 VITALS — BP 110/70 | HR 90 | Temp 98.6°F | Resp 12 | Wt 137.6 lb

## 2017-10-25 DIAGNOSIS — R05 Cough: Secondary | ICD-10-CM | POA: Diagnosis not present

## 2017-10-25 DIAGNOSIS — J069 Acute upper respiratory infection, unspecified: Secondary | ICD-10-CM | POA: Diagnosis not present

## 2017-10-25 DIAGNOSIS — R059 Cough, unspecified: Secondary | ICD-10-CM

## 2017-10-25 MED ORDER — BENZONATATE 100 MG PO CAPS: 200.0000 mg | ORAL_CAPSULE | Freq: Two times a day (BID) | ORAL | 0 refills | Status: AC | PRN

## 2017-10-25 MED ORDER — HYDROCODONE-HOMATROPINE 5-1.5 MG/5ML PO SYRP: 5.0000 mL | ORAL_SOLUTION | Freq: Two times a day (BID) | ORAL | 0 refills | Status: AC | PRN

## 2017-10-25 MED ORDER — FLUTICASONE PROPIONATE 50 MCG/ACT NA SUSP: 1.0000 | Freq: Two times a day (BID) | NASAL | 0 refills | Status: DC

## 2017-10-25 NOTE — Progress Notes (Signed)
ACUTE VISIT  HPI:  Chief Complaint  Patient presents with  . Cough    Janet Nichols is a 47 y.o.female here today complaining of 3 days of respiratory symptoms.  Productive cough with clear sputum, chills, nasal congestion, and rhinorrhea. She has not noted chest pain, dyspnea, or wheezing. Cough is interfering with his sleep.  Negative for fever. + Body aches. She has had some sore throat, exacerbated by coughing.  URI   This is a new problem. The current episode started in the past 7 days. The problem has been unchanged. There has been no fever. Associated symptoms include congestion, coughing, rhinorrhea and a sore throat. Pertinent negatives include no abdominal pain, chest pain, diarrhea, ear pain, headaches, joint pain, joint swelling, nausea, neck pain, plugged ear sensation, rash, sinus pain, swollen glands, vomiting or wheezing. The treatment provided mild relief.    No Hx of recent travel. Her husband was sick with similar symptoms last week. No known insect bite.  Hx of allergies: No  OTC medications for this problem: OTC cough medication but it did not help much.    Review of Systems  Constitutional: Positive for activity change, appetite change, chills and fatigue. Negative for fever.  HENT: Positive for congestion, postnasal drip, rhinorrhea and sore throat. Negative for ear pain, mouth sores, sinus pressure, sinus pain, trouble swallowing and voice change.   Eyes: Negative for discharge, redness and itching.  Respiratory: Positive for cough. Negative for shortness of breath and wheezing.   Cardiovascular: Negative for chest pain.  Gastrointestinal: Negative for abdominal pain, diarrhea, nausea and vomiting.  Musculoskeletal: Positive for myalgias. Negative for gait problem, joint pain, joint swelling and neck pain.  Skin: Negative for rash.  Allergic/Immunologic: Negative for environmental allergies.  Neurological: Negative for syncope,  weakness and headaches.  Hematological: Negative for adenopathy. Does not bruise/bleed easily.      Current Outpatient Medications on File Prior to Visit  Medication Sig Dispense Refill  . augmented betamethasone dipropionate (DIPROLENE-AF) 0.05 % cream APPLY CREAM TOPICALLY TO AFFECTED AREA UP TO TWICE DAILY AS NEEDED. DO NOT USE ON FACE GROIN UNDERARMS.  3   No current facility-administered medications on file prior to visit.      Past Medical History:  Diagnosis Date  . Acne   . Gestational diabetes   . Hepatitis B   . IC (interstitial cystitis)   . Low HDL (under 40)   . Rosacea    Allergies  Allergen Reactions  . Sulfamethoxazole-Trimethoprim     REACTION: ? rash    Social History   Socioeconomic History  . Marital status: Married    Spouse name: Not on file  . Number of children: Not on file  . Years of education: Not on file  . Highest education level: Not on file  Occupational History  . Not on file  Social Needs  . Financial resource strain: Not on file  . Food insecurity:    Worry: Not on file    Inability: Not on file  . Transportation needs:    Medical: Not on file    Non-medical: Not on file  Tobacco Use  . Smoking status: Never Smoker  . Smokeless tobacco: Never Used  Substance and Sexual Activity  . Alcohol use: No    Alcohol/week: 0.0 standard drinks  . Drug use: No  . Sexual activity: Not on file  Lifestyle  . Physical activity:    Days per week: Not on file  Minutes per session: Not on file  . Stress: Not on file  Relationships  . Social connections:    Talks on phone: Not on file    Gets together: Not on file    Attends religious service: Not on file    Active member of club or organization: Not on file    Attends meetings of clubs or organizations: Not on file    Relationship status: Not on file  Other Topics Concern  . Not on file  Social History Narrative   Married   HH of 4       No pets   Sleep 7 hours   40 hours at  least 10 hours days mon th friday   Exercise sometimes      She works as a Glass blower/designer          Vitals:   10/25/17 1212  BP: 110/70  Pulse: 90  Resp: 12  Temp: 98.6 F (37 C)  SpO2: 96%   Body mass index is 25.17 kg/m.   Physical Exam  Constitutional: She is oriented to person, place, and time. She appears well-developed. She does not appear ill. No distress.  HENT:  Head: Atraumatic.  Right Ear: Tympanic membrane, external ear and ear canal normal.  Left Ear: Tympanic membrane, external ear and ear canal normal.  Nose: Rhinorrhea present. Right sinus exhibits no maxillary sinus tenderness and no frontal sinus tenderness. Left sinus exhibits no maxillary sinus tenderness and no frontal sinus tenderness.  Mouth/Throat: Uvula is midline and mucous membranes are normal. Posterior oropharyngeal erythema (Mild) present. No oropharyngeal exudate or posterior oropharyngeal edema.  Hypertrophic turbinates. Nasal voice. Postnasal drainage.  Eyes: Conjunctivae are normal.  Cardiovascular: Normal rate and regular rhythm.  No murmur heard. Respiratory: Effort normal and breath sounds normal. No stridor. No respiratory distress.  Lymphadenopathy:       Head (right side): No submandibular adenopathy present.       Head (left side): No submandibular adenopathy present.    She has no cervical adenopathy.  Neurological: She is alert and oriented to person, place, and time. She has normal strength.  Skin: Skin is warm. No rash noted. No erythema.  Psychiatric: She has a normal mood and affect.  Well groomed, good eye contact.      ASSESSMENT AND PLAN:  Janet Nichols was seen today for cough.  Diagnoses and all orders for this visit:  URI, acute -     fluticasone (FLONASE) 50 MCG/ACT nasal spray; Place 1 spray into both nostrils 2 (two) times daily.  Cough -     HYDROcodone-homatropine (HYCODAN) 5-1.5 MG/5ML syrup; Take 5 mLs by mouth every 12 (twelve) hours as needed for up to  10 days for cough. -     benzonatate (TESSALON) 100 MG capsule; Take 2 capsules (200 mg total) by mouth 2 (two) times daily as needed for up to 10 days for cough.    Rapid flu done here in the office was negative. Symptoms suggests a viral etiology, symptomatic treatment recommended at this time. Instructed to monitor for signs of complications, clearly instructed about warning signs. Side effects of Hycodan discussed. Saline nasal irrigations several times per day and as needed recommended.  I also explained that cough and nasal congestion can last a few days and sometimes weeks. F/U with PCP as needed.      Abdulaziz Toman G. [place], MD  [facility]. [place] office.

## 2017-10-25 NOTE — Patient Instructions (Addendum)
  [name] I have seen you today for an acute visit.  A few things to remember from today's visit:   URI, acute - Plan: fluticasone (FLONASE) 50 MCG/ACT nasal spray  Cough - Plan: HYDROcodone-homatropine (HYCODAN) 5-1.5 MG/5ML syrup, benzonatate (TESSALON) 100 MG capsule viral infections are self-limited and we treat each symptom depending of severity.  Over the counter medications as decongestants and cold medications usually help, they need to be taken with caution if there is a history of high blood pressure or palpitations. Tylenol and/or Ibuprofen also helps with most symptoms (headache, muscle aching, fever,etc) Plenty of fluids. Honey helps with cough. Steam inhalations helps with runny nose, nasal congestion, and may prevent sinus infections. Cough and nasal congestion could last a few days and sometimes weeks.    If medications prescribed today, they will not be refill upon request, a follow up appointment with PCP will be necessary to discuss continuation of of treatment if appropriate.     In general please monitor for signs of worsening symptoms and seek immediate medical attention if any concerning.  If symptoms are not resolved in 1-2 weeks you should schedule a follow up appointment with your doctor, before if needed.  I hope you get better soon!

## 2017-10-29 ENCOUNTER — Ambulatory Visit: Payer: 59

## 2017-10-29 ENCOUNTER — Telehealth: Payer: Self-pay | Admitting: *Deleted

## 2017-10-29 NOTE — Telephone Encounter (Signed)
I called the pt to cancel the appt for a second Hepatitis A vaccine today as this should be due in 6 months.  I advised the pt she only needs 2 injections in total and to keep the appt in March and she agreed.

## 2017-10-30 ENCOUNTER — Ambulatory Visit (INDEPENDENT_AMBULATORY_CARE_PROVIDER_SITE_OTHER): Payer: 59 | Admitting: Family Medicine

## 2017-10-30 ENCOUNTER — Encounter: Payer: Self-pay | Admitting: Family Medicine

## 2017-10-30 VITALS — BP 116/84 | HR 94 | Temp 98.7°F | Wt 137.0 lb

## 2017-10-30 DIAGNOSIS — J329 Chronic sinusitis, unspecified: Secondary | ICD-10-CM | POA: Diagnosis not present

## 2017-10-30 DIAGNOSIS — B9689 Other specified bacterial agents as the cause of diseases classified elsewhere: Secondary | ICD-10-CM

## 2017-10-30 MED ORDER — PROMETHAZINE-DM 6.25-15 MG/5ML PO SYRP: 5.0000 mL | ORAL_SOLUTION | Freq: Four times a day (QID) | ORAL | 0 refills | Status: DC | PRN

## 2017-10-30 MED ORDER — AMOXICILLIN 875 MG PO TABS: 875.0000 mg | ORAL_TABLET | Freq: Two times a day (BID) | ORAL | 0 refills | Status: DC

## 2017-10-30 NOTE — Patient Instructions (Addendum)
Follow up as needed or as scheduled START the Amoxicillin twice daily- take w/ food DRINK plenty of fluids REST! Use the cough syrup as needed- do not take more than directed ADD Mucinex to thin your congestion and improve your cough Call with any questions or concerns Hang in there!!

## 2017-10-30 NOTE — Progress Notes (Signed)
   Subjective:    Patient ID: Carmie Lanpher, female    DOB: [birth year], 47 y.o.   MRN: [id number]  HPI URI- pt was seen 10/21 and dx'd w/ viral URI.  'constant coughing'.  + laryngitis.  No fevers.  + nasal congestion, denies sinus pain/pressure.  + HA  Denies ear pain.  + sick contacts.  No relief w/ Tessalon, some relief w/ Hycodan but she has run out.    Review of Systems For ROS see HPI     Objective:   Physical Exam  Constitutional: She appears well-developed and well-nourished. No distress.  HENT:  Head: Normocephalic and atraumatic.  Right Ear: Tympanic membrane normal.  Left Ear: Tympanic membrane normal.  Nose: Mucosal edema and rhinorrhea present. Right sinus exhibits maxillary sinus tenderness and frontal sinus tenderness. Left sinus exhibits maxillary sinus tenderness and frontal sinus tenderness.  Mouth/Throat: Uvula is midline and mucous membranes are normal. Posterior oropharyngeal erythema present. No oropharyngeal exudate.  TTP over maxillary > frontal w/ swelling over maxillary sinusitis  Eyes: Pupils are equal, round, and reactive to light. Conjunctivae and EOM are normal.  Neck: Normal range of motion. Neck supple.  Cardiovascular: Normal rate, regular rhythm and normal heart sounds.  Pulmonary/Chest: Effort normal and breath sounds normal. No respiratory distress. She has no wheezes.  Near continuous cough  Lymphadenopathy:    She has no cervical adenopathy.  Vitals reviewed.         Assessment & Plan:  Bacterial sinusitis- new.  Pt w/ TTP and swelling over maxillary sinuses.  Start Amoxicillin.  Discussed her copious drainage as the cause of her cough.  She has overused the Hycodan cough syrup to be out after 5 days so will not refill at this time.  Prescription for promethazine cough syrup given.  Reviewed supportive care and red flags that should prompt return.  Pt expressed understanding and is in agreement w/ plan.

## 2017-11-02 ENCOUNTER — Encounter: Payer: Self-pay | Admitting: *Deleted

## 2017-11-02 ENCOUNTER — Other Ambulatory Visit: Payer: Self-pay | Admitting: Internal Medicine

## 2017-11-02 DIAGNOSIS — R932 Abnormal findings on diagnostic imaging of liver and biliary tract: Secondary | ICD-10-CM

## 2017-11-03 ENCOUNTER — Encounter: Payer: Self-pay | Admitting: Gastroenterology

## 2017-11-29 ENCOUNTER — Ambulatory Visit: Payer: 59 | Admitting: Gastroenterology

## 2017-12-08 NOTE — Progress Notes (Signed)
Referring Provider: Burnis Medin, MD Primary Care Physician:  Burnis Medin, MD   Reason for Consultation: Abnormal liver ultrasound   IMPRESSION:  Echogenic liver on ultrasound HBeAg negative chronic hepatitis B with normal liver enzymes    - HBV DNA VL 18,000 IU/mL 09/17/17    - HCV and HIV negative Family history of colon polyps (sister with 6 precancerous polyps at age 48; other sister with 7 precancerous polyps around age 101)  33 concurrent liver disease given the echogenic liver on ultrasound.  Will need close monitoring to determine timing of antiviral therapy.   PLAN: ANA, IgG, Smooth muscle antibody, ferritin, iron, fasting insulin, fasting glucose, HCV RNA viral load Hepatitis B labs every 3 months (hepatitis function panel, HBV DNA) HBeAb testing with next labs HAV vaccine Colonoscopy Sons should be vaccinated for hepatitis B Return to clinic every 6 months (she is considering following more closely with Dr. Regis Bill with a rereferral for any changes)  I consented the patient at the bedside today discussing the risks, benefits, and alternatives to endoscopic evaluation. In particular, we discussed the risks that include, but are not limited to, reaction to medication, cardiopulmonary compromise, bleeding requiring blood transfusion, aspiration resulting in pneumonia, perforation requiring surgery, and even death. We reviewed the risk of missed lesion including polyps or even cancer. The patient acknowledges these risks and asks that we proceed.  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 Regis Bill for further evaluation of an abnormal liver ultrasound.  Moved to [place] in 1979. The history is obtained through the patient review of her electronic health record.  She has had no symptoms.  Was born in [place] but is [place]. Does not remember if she had immunizations.  She has not donated blood so has  not been tested before. No associated symptoms.   Two sons - she is unsure if they have been vaccinated for HBV.   Labs from 09/17/2017 show an ALT 11, AST 15, alk phos 55, total bilirubin 0.8, total protein 7.2.  Hepatitis B surface antigen positive, hep B core antibody total positive, hep B surface antibody negative, hep B E antigen negative, hep B DNA viral load 18,000 IU/mL.  Hep C antibody negative.  A right upper quadrant doppler abdominal ultrasound 10/15/2017 showed a mildly echogenic liver, normal gallbladder, and normal spleen.  Normal Doppler studies of the portal vein hepatic vein.  Brother with hepatitis B. Husband has hepatitis B.   Two sisters have had colon polyps. One sister had 6 precancerous polyps at age 48. The other had 7 precancerous polyps around age 48.  She has never had a colonoscopy.   Past Medical History:  Diagnosis Date  . Acne   . Gestational diabetes   . Hepatitis B   . IC (interstitial cystitis)   . Low HDL (under 40)   . Rosacea     Past Surgical History:  Procedure Laterality Date  . bladder stretched    . ESSURE TUBAL LIGATION  2011    Current Outpatient Medications  Medication Sig Dispense Refill  . augmented betamethasone dipropionate (DIPROLENE-AF) 0.05 % cream APPLY CREAM TOPICALLY TO AFFECTED AREA UP TO TWICE DAILY AS NEEDED. DO NOT USE ON FACE GROIN UNDERARMS.  3  . Na Sulfate-K Sulfate-Mg Sulf (SUPREP BOWEL PREP KIT) 17.5-3.13-1.6 GM/177ML SOLN Take 1 kit by mouth as directed. 324 mL 0   No current facility-administered  medications for this visit.     Allergies as of 12/10/2017 - Review Complete 12/10/2017  Allergen Reaction Noted  . Sulfamethoxazole-trimethoprim  01/18/2008    Family History  Problem Relation Age of Onset  . Hypertension Mother   . Diabetes Mother   . Kidney failure Mother        mom on dialysis has dn  . Stroke Mother   . Hypertension Sister   . Colon polyps Sister        58  . Breast cancer Cousin   .  Colon polyps Sister        56  . Colon cancer Neg Hx     Social History   Socioeconomic History  . Marital status: Married    Spouse name: Not on file  . Number of children: Not on file  . Years of education: Not on file  . Highest education level: Not on file  Occupational History  . Not on file  Social Needs  . Financial resource strain: Not on file  . Food insecurity:    Worry: Not on file    Inability: Not on file  . Transportation needs:    Medical: Not on file    Non-medical: Not on file  Tobacco Use  . Smoking status: Never Smoker  . Smokeless tobacco: Never Used  Substance and Sexual Activity  . Alcohol use: No    Alcohol/week: 0.0 standard drinks  . Drug use: No  . Sexual activity: Not on file  Lifestyle  . Physical activity:    Days per week: Not on file    Minutes per session: Not on file  . Stress: Not on file  Relationships  . Social connections:    Talks on phone: Not on file    Gets together: Not on file    Attends religious service: Not on file    Active member of club or organization: Not on file    Attends meetings of clubs or organizations: Not on file    Relationship status: Not on file  . Intimate partner violence:    Fear of current or ex partner: Not on file    Emotionally abused: Not on file    Physically abused: Not on file    Forced sexual activity: Not on file  Other Topics Concern  . Not on file  Social History Narrative   Married   HH of 4       No pets   Sleep 7 hours   40 hours at least 10 hours days mon th friday   Exercise sometimes      She works as a Glass blower/designer          Review of Systems: [place] is negative except as noted above.  Filed Weights   12/10/17 1346  Weight: 137 lb (62.1 kg)    Physical Exam: Vital signs were reviewed. General:   Alert, well-nourished, pleasant and cooperative in NAD Head:  Normocephalic and atraumatic. Eyes:  Sclera clear, no icterus.   Conjunctiva pink. Mouth:  No  deformity or lesions.   Neck:  Supple; no thyromegaly. Lungs:  Clear throughout to auscultation.   No wheezes.  Heart:  Regular rate and rhythm; no murmurs Abdomen:  Soft, nontender, normal bowel sounds. No rebound or guarding. No hepatosplenomegaly Rectal:  Deferred  Msk:  Symmetrical without gross deformities. Extremities:  No gross deformities or edema. Neurologic:  Alert and  oriented x4;  grossly nonfocal Skin:  No rash  or bruise. Psych:  Alert and cooperative. Normal mood and affect.   Imaya Duffy L. Tarri Glenn, MD, MPH [facility] 12/20/2017, 9:04 PM

## 2017-12-10 ENCOUNTER — Other Ambulatory Visit (INDEPENDENT_AMBULATORY_CARE_PROVIDER_SITE_OTHER): Payer: 59

## 2017-12-10 ENCOUNTER — Encounter: Payer: Self-pay | Admitting: Gastroenterology

## 2017-12-10 ENCOUNTER — Ambulatory Visit (INDEPENDENT_AMBULATORY_CARE_PROVIDER_SITE_OTHER): Payer: 59 | Admitting: Gastroenterology

## 2017-12-10 VITALS — BP 132/84 | HR 65 | Ht 62.0 in | Wt 137.0 lb

## 2017-12-10 DIAGNOSIS — B181 Chronic viral hepatitis B without delta-agent: Secondary | ICD-10-CM | POA: Diagnosis not present

## 2017-12-10 DIAGNOSIS — R932 Abnormal findings on diagnostic imaging of liver and biliary tract: Secondary | ICD-10-CM | POA: Diagnosis not present

## 2017-12-10 DIAGNOSIS — Z8371 Family history of colonic polyps: Secondary | ICD-10-CM

## 2017-12-10 LAB — FERRITIN: Ferritin: 69.1 ng/mL (ref 10.0–291.0)

## 2017-12-10 LAB — IRON: Iron: 130 ug/dL (ref 42–145)

## 2017-12-10 LAB — GLUCOSE, RANDOM: Glucose, Bld: 114 mg/dL — ABNORMAL HIGH (ref 70–99)

## 2017-12-10 MED ORDER — NA SULFATE-K SULFATE-MG SULF 17.5-3.13-1.6 GM/177ML PO SOLN: 1.0000 | ORAL | 0 refills | Status: DC

## 2017-12-10 NOTE — Patient Instructions (Signed)
Your provider has requested that you go to the basement level for lab work before leaving today. Press "B" on the elevator. The lab is located at the first door on the left as you exit the elevator.  You have been scheduled for a colonoscopy. Please follow written instructions given to you at your visit today.  Please pick up your prep supplies at the pharmacy within the next 1-3 days. If you use inhalers (even only as needed), please bring them with you on the day of your procedure. Your physician has requested that you go to www.startemmi.com and enter the access code given to you at your visit today. This web site gives a general overview about your procedure. However, you should still follow specific instructions given to you by our office regarding your preparation for the procedure.  

## 2017-12-14 LAB — ANA: Anti Nuclear Antibody(ANA): NEGATIVE

## 2017-12-14 LAB — IGG: IGG (IMMUNOGLOBIN G), SERUM: 1533 mg/dL (ref 600–1640)

## 2017-12-14 LAB — HEPATITIS C VRS RNA DETECT BY PCR-QUAL: Hepatitis C Vrs RNA by PCR-Qual: NOT DETECTED

## 2017-12-14 LAB — ANTI-SMOOTH MUSCLE ANTIBODY, IGG: Actin (Smooth Muscle) Antibody (IGG): <20 U (ref <20)

## 2017-12-16 ENCOUNTER — Other Ambulatory Visit: Payer: Self-pay | Admitting: Emergency Medicine

## 2017-12-16 DIAGNOSIS — B181 Chronic viral hepatitis B without delta-agent: Secondary | ICD-10-CM

## 2017-12-20 ENCOUNTER — Encounter: Payer: Self-pay | Admitting: Gastroenterology

## 2017-12-21 ENCOUNTER — Other Ambulatory Visit: Payer: 59

## 2017-12-21 ENCOUNTER — Encounter: Payer: Self-pay | Admitting: Internal Medicine

## 2017-12-21 DIAGNOSIS — B181 Chronic viral hepatitis B without delta-agent: Secondary | ICD-10-CM

## 2017-12-25 LAB — INSULIN, FREE AND TOTAL
Free Insulin: 7.3 uU/mL
Total Insulin: 7.3 uU/mL

## 2017-12-27 ENCOUNTER — Other Ambulatory Visit (INDEPENDENT_AMBULATORY_CARE_PROVIDER_SITE_OTHER): Payer: 59

## 2017-12-27 DIAGNOSIS — R768 Other specified abnormal immunological findings in serum: Secondary | ICD-10-CM

## 2017-12-27 DIAGNOSIS — B181 Chronic viral hepatitis B without delta-agent: Secondary | ICD-10-CM | POA: Diagnosis not present

## 2017-12-27 LAB — HEPATIC FUNCTION PANEL
ALT: 13 U/L (ref 0–35)
AST: 18 U/L (ref 0–37)
Albumin: 4.3 g/dL (ref 3.5–5.2)
Alkaline Phosphatase: 54 U/L (ref 39–117)
Bilirubin, Direct: 0.1 mg/dL (ref 0.0–0.3)
Total Bilirubin: 0.8 mg/dL (ref 0.2–1.2)
Total Protein: 7.2 g/dL (ref 6.0–8.3)

## 2017-12-31 LAB — HEPATITIS B DNA, ULTRAQUANTITATIVE, PCR
HEPATITIS B DNA (CALC): 3.67 {Log_IU}/mL — AB
Hepatitis B DNA: 4650 IU/mL — ABNORMAL HIGH

## 2017-12-31 LAB — HEPATITIS DELTA VIRUS ANTIGEN: Hepatitis D Antigen: NOT DETECTED

## 2018-01-13 ENCOUNTER — Encounter: Payer: 59 | Admitting: Gastroenterology

## 2018-01-21 ENCOUNTER — Other Ambulatory Visit: Payer: Self-pay

## 2018-02-10 DIAGNOSIS — L810 Postinflammatory hyperpigmentation: Secondary | ICD-10-CM | POA: Diagnosis not present

## 2018-03-28 ENCOUNTER — Other Ambulatory Visit: Payer: Self-pay

## 2018-03-28 ENCOUNTER — Ambulatory Visit (INDEPENDENT_AMBULATORY_CARE_PROVIDER_SITE_OTHER): Payer: 59 | Admitting: *Deleted

## 2018-03-28 DIAGNOSIS — Z23 Encounter for immunization: Secondary | ICD-10-CM

## 2018-03-28 NOTE — Progress Notes (Signed)
Per orders of Dr. Sarajane Jews in Dr Velora Mediate absence, injection of Hep A #2 given by Virl Cagey. Patient tolerated injection well.

## 2018-10-06 LAB — HM MAMMOGRAPHY

## 2018-11-16 ENCOUNTER — Encounter: Payer: Self-pay | Admitting: Internal Medicine

## 2019-03-29 ENCOUNTER — Encounter: Payer: Self-pay | Admitting: Gastroenterology

## 2019-04-20 ENCOUNTER — Ambulatory Visit (AMBULATORY_SURGERY_CENTER): Payer: 59 | Admitting: *Deleted

## 2019-04-20 ENCOUNTER — Other Ambulatory Visit: Payer: Self-pay

## 2019-04-20 VITALS — Temp 97.0°F | Ht 62.0 in | Wt 126.6 lb

## 2019-04-20 DIAGNOSIS — Z1211 Encounter for screening for malignant neoplasm of colon: Secondary | ICD-10-CM

## 2019-04-20 DIAGNOSIS — Z8371 Family history of colonic polyps: Secondary | ICD-10-CM

## 2019-04-20 NOTE — Progress Notes (Signed)

## 2019-04-20 NOTE — Progress Notes (Signed)
Provided sutab sample, [id number] exp 03/2020

## 2019-05-03 ENCOUNTER — Other Ambulatory Visit: Payer: Self-pay

## 2019-05-03 ENCOUNTER — Ambulatory Visit (AMBULATORY_SURGERY_CENTER): Payer: 59 | Admitting: Gastroenterology

## 2019-05-03 ENCOUNTER — Encounter: Payer: Self-pay | Admitting: Gastroenterology

## 2019-05-03 VITALS — BP 147/96 | HR 63 | Temp 96.2°F | Resp 13 | Ht 62.0 in | Wt 126.6 lb

## 2019-05-03 DIAGNOSIS — Z8371 Family history of colonic polyps: Secondary | ICD-10-CM

## 2019-05-03 DIAGNOSIS — D120 Benign neoplasm of cecum: Secondary | ICD-10-CM

## 2019-05-03 DIAGNOSIS — D122 Benign neoplasm of ascending colon: Secondary | ICD-10-CM

## 2019-05-03 DIAGNOSIS — Z1211 Encounter for screening for malignant neoplasm of colon: Secondary | ICD-10-CM | POA: Diagnosis not present

## 2019-05-03 HISTORY — PX: COLONOSCOPY: SHX174

## 2019-05-03 MED ORDER — SODIUM CHLORIDE 0.9 % IV SOLN: 500.0000 mL | Freq: Once | INTRAVENOUS | Status: DC

## 2019-05-03 NOTE — Progress Notes (Signed)
To PACU, VSS. Report to [email or web address] 

## 2019-05-03 NOTE — Progress Notes (Signed)
No problems noted in the recovery room. maw 

## 2019-05-03 NOTE — Op Note (Signed)
[place] Patient Name: Axelle Deely Procedure Date: 05/03/2019 10:36 AM MRN: [id number] Endoscopist: Thornton Park MD, MD Age: 49 Referring MD:  Date of Birth: [birth year] Gender: Female Account #: [id number] Procedure:                Colonoscopy Indications:              Colon cancer screening in patient at increased                            risk: Family history of 1st-degree relative with                            colon polyps before age 45 years                           Family history of colon polyps (sister with 6                            precancerous polyps at age 70; other sister with 7                            precancerous polyps around age 27) Medicines:                Monitored Anesthesia Care Procedure:                Pre-Anesthesia Assessment:                           - Prior to the procedure, a History and Physical                            was performed, and patient medications and                            allergies were reviewed. The patient's tolerance of                            previous anesthesia was also reviewed. The risks                            and benefits of the procedure and the sedation                            options and risks were discussed with the patient.                            All questions were answered, and informed consent                            was obtained. Prior Anticoagulants: The patient has                            taken no previous anticoagulant or antiplatelet  agents. ASA Grade Assessment: II - A patient with                            mild systemic disease. After reviewing the risks                            and benefits, the patient was deemed in                            satisfactory condition to undergo the procedure.                           After obtaining informed consent, the colonoscope                            was passed under direct vision. Throughout the                          procedure, the patient's blood pressure, pulse, and                            oxygen saturations were monitored continuously. The                            Colonoscope was introduced through the anus and                            advanced to the 5 cm into the ileum. The                            colonoscopy was performed without difficulty. The                            patient tolerated the procedure well. The quality                            of the bowel preparation was good. The terminal                            ileum, ileocecal valve, appendiceal orifice, and                            rectum were photographed. Scope In: 10:47:10 AM Scope Out: 11:05:50 AM Scope Withdrawal Time: 0 hours 15 minutes 7 seconds  Total Procedure Duration: 0 hours 18 minutes 40 seconds  Findings:                 The perianal and digital rectal examinations were                            normal.                           The mucosa in the proximal ascending colon had  multiple locations that appeared slightly nodular                            without discrete margins for polyps. Ten sessile                            polyps were found throughout this area. The polyps                            were 1 to 3 mm in size. These polyps were removed                            with a cold snare. Resection and retrieval were                            complete. Estimated blood loss was minimal.                           The cecum has a nodular appearance that may be                            multiple lymphoid follicles. However, there were                            two discrete sessile polyps were found in the                            cecum. The polyps were 1 to 2 mm in size. These                            polyps were removed with a cold snare. Resection                            and retrieval were complete. Estimated blood loss                             was minimal.                           The exam was otherwise without abnormality on                            direct and retroflexion views. Complications:            No immediate complications. Estimated blood loss:                            Minimal. Estimated Blood Loss:     Estimated blood loss was minimal. Impression:               - Ten 1 to 3 mm polyps in the ascending colon,                            removed with a cold  snare. Resected and retrieved.                           - Two 1 to 2 mm polyps in the cecum, removed with a                            cold snare. Resected and retrieved.                           - The examination was otherwise normal on direct                            and retroflexion views. Recommendation:           - Patient has a contact number available for                            emergencies. The signs and symptoms of potential                            delayed complications were discussed with the                            patient. Return to normal activities tomorrow.                            Written discharge instructions were provided to the                            patient.                           - Resume previous diet.                           - Continue present medications.                           - Await pathology results.                           - Repeat colonoscopy date to be determined after                            pending pathology results are reviewed for                            surveillance.                           - Consider genetic counseling given your family                            history.                           - Emerging evidence supports eating a diet of  fruits, vegetables, grains, calcium, and yogurt                            while reducing red meat and alcohol may reduce the                            risk of colon cancer.                           - Thank you for  allowing me to be involved in your                            colon cancer prevention. Thornton Park MD, MD 05/03/2019 11:15:06 AM This report has been signed electronically.

## 2019-05-03 NOTE — Patient Instructions (Signed)
YOU HAD AN ENDOSCOPIC PROCEDURE TODAY AT [facility]:   Refer to the procedure report that was given to you for any specific questions about what was found during the examination.  If the procedure report does not answer your questions, please call your gastroenterologist to clarify.  If you requested that your care partner not be given the details of your procedure findings, then the procedure report has been included in a sealed envelope for you to review at your convenience later.  YOU SHOULD EXPECT: Some feelings of bloating in the abdomen. Passage of more gas than usual.  Walking can help get rid of the air that was put into your GI tract during the procedure and reduce the bloating. If you had a lower endoscopy (such as a colonoscopy or flexible sigmoidoscopy) you may notice spotting of blood in your stool or on the toilet paper. If you underwent a bowel prep for your procedure, you may not have a normal bowel movement for a few days.  Please Note:  You might notice some irritation and congestion in your nose or some drainage.  This is from the oxygen used during your procedure.  There is no need for concern and it should clear up in a day or so.  SYMPTOMS TO REPORT IMMEDIATELY:   Following lower endoscopy (colonoscopy or flexible sigmoidoscopy):  Excessive amounts of blood in the stool  Significant tenderness or worsening of abdominal pains  Swelling of the abdomen that is new, acute  Fever of 100F or higher    For urgent or emergent issues, a gastroenterologist can be reached at any hour by calling [phone]. Do not use MyChart messaging for urgent concerns.    DIET:  We do recommend a small meal at first, but then you may proceed to your regular diet.  Drink plenty of fluids but you should avoid alcoholic beverages for 24 hours.  ACTIVITY:  You should plan to take it easy for the rest of today and you should NOT DRIVE or use heavy machinery until tomorrow  (because of the sedation medicines used during the test).    FOLLOW UP: Our staff will call the number listed on your records 48-72 hours following your procedure to check on you and address any questions or concerns that you may have regarding the information given to you following your procedure. If we do not reach you, we will leave a message.  We will attempt to reach you two times.  During this call, we will ask if you have developed any symptoms of COVID 19. If you develop any symptoms (ie: fever, flu-like symptoms, shortness of breath, cough etc.) before then, please call [phone].  If you test positive for Covid 19 in the 2 weeks post procedure, please call and report this information to [name].    If any biopsies were taken you will be contacted by phone or by letter within the next 1-3 weeks.  Please call us at [phone] if you have not heard about the biopsies in 3 weeks.    SIGNATURES/CONFIDENTIALITY: You and/or your care partner have signed paperwork which will be entered into your electronic medical record.  These signatures attest to the fact that that the information above on your After Visit Summary has been reviewed and is understood.  Full responsibility of the confidentiality of this discharge information lies with you and/or your care-partner.    Handout was given to you on polyps. Emerging evidence supports eating a diet of  fruits, vegetables, grains, calcium, and yogurt while reducing red meat and alcohol may reduce the risk of colon cancer.  You may resume your current medications today. Await biopsy results. Consider genetic counseling given your family history.  The office nurse will call you to arrange. Please call if any questions or concerns.

## 2019-05-03 NOTE — Progress Notes (Signed)
Called to room to assist during endoscopic procedure.  Patient ID and intended procedure confirmed with present staff. Received instructions for my participation in the procedure from the performing physician.  

## 2019-05-03 NOTE — Progress Notes (Signed)
Temp-JB [id number]  Pt's states no medical or surgical changes since previsit or office visit.  

## 2019-05-04 ENCOUNTER — Other Ambulatory Visit: Payer: Self-pay

## 2019-05-05 ENCOUNTER — Encounter: Payer: Self-pay | Admitting: Internal Medicine

## 2019-05-05 ENCOUNTER — Other Ambulatory Visit: Payer: Self-pay

## 2019-05-05 ENCOUNTER — Ambulatory Visit (INDEPENDENT_AMBULATORY_CARE_PROVIDER_SITE_OTHER): Payer: 59 | Admitting: Internal Medicine

## 2019-05-05 ENCOUNTER — Telehealth: Payer: Self-pay

## 2019-05-05 VITALS — BP 118/78 | HR 61 | Temp 97.9°F | Ht 61.5 in | Wt 130.2 lb

## 2019-05-05 DIAGNOSIS — Z0000 Encounter for general adult medical examination without abnormal findings: Secondary | ICD-10-CM | POA: Diagnosis not present

## 2019-05-05 DIAGNOSIS — Z833 Family history of diabetes mellitus: Secondary | ICD-10-CM | POA: Diagnosis not present

## 2019-05-05 DIAGNOSIS — R739 Hyperglycemia, unspecified: Secondary | ICD-10-CM | POA: Diagnosis not present

## 2019-05-05 DIAGNOSIS — B181 Chronic viral hepatitis B without delta-agent: Secondary | ICD-10-CM | POA: Diagnosis not present

## 2019-05-05 LAB — LIPID PANEL
Cholesterol: 168 mg/dL (ref 0–200)
HDL: 41.7 mg/dL (ref >39.00)
LDL Cholesterol: 96 mg/dL (ref 0–99)
NonHDL: 126.33
Total CHOL/HDL Ratio: 4
Triglycerides: 152 mg/dL — ABNORMAL HIGH (ref 0.0–149.0)
VLDL: 30.4 mg/dL (ref 0.0–40.0)

## 2019-05-05 LAB — BASIC METABOLIC PANEL
BUN: 11 mg/dL (ref 6–23)
CO2: 30 mEq/L (ref 19–32)
Calcium: 9.1 mg/dL (ref 8.4–10.5)
Chloride: 105 mEq/L (ref 96–112)
Creatinine, Ser: 0.61 mg/dL (ref 0.40–1.20)
GFR: 103.91 mL/min (ref >60.00)
Glucose, Bld: 93 mg/dL (ref 70–99)
Potassium: 3.7 mEq/L (ref 3.5–5.1)
Sodium: 143 mEq/L (ref 135–145)

## 2019-05-05 LAB — HEPATIC FUNCTION PANEL
ALT: 11 U/L (ref 0–35)
AST: 18 U/L (ref 0–37)
Albumin: 4.3 g/dL (ref 3.5–5.2)
Alkaline Phosphatase: 66 U/L (ref 39–117)
Bilirubin, Direct: 0.1 mg/dL (ref 0.0–0.3)
Total Bilirubin: 0.6 mg/dL (ref 0.2–1.2)
Total Protein: 7.3 g/dL (ref 6.0–8.3)

## 2019-05-05 LAB — CBC WITH DIFFERENTIAL/PLATELET
Basophils Absolute: 0 10*3/uL (ref 0.0–0.1)
Basophils Relative: 0.7 % (ref 0.0–3.0)
Eosinophils Absolute: 0.1 10*3/uL (ref 0.0–0.7)
Eosinophils Relative: 1.6 % (ref 0.0–5.0)
HCT: 39.6 % (ref 36.0–46.0)
Hemoglobin: 13.5 g/dL (ref 12.0–15.0)
Lymphocytes Relative: 40.8 % (ref 12.0–46.0)
Lymphs Abs: 2.5 10*3/uL (ref 0.7–4.0)
MCHC: 34 g/dL (ref 30.0–36.0)
MCV: 91.8 fl (ref 78.0–100.0)
Monocytes Absolute: 0.5 10*3/uL (ref 0.1–1.0)
Monocytes Relative: 7.7 % (ref 3.0–12.0)
Neutro Abs: 3 10*3/uL (ref 1.4–7.7)
Neutrophils Relative %: 49.2 % (ref 43.0–77.0)
Platelets: 224 10*3/uL (ref 150.0–400.0)
RBC: 4.32 Mil/uL (ref 3.87–5.11)
RDW: 13 % (ref 11.5–15.5)
WBC: 6.1 10*3/uL (ref 4.0–10.5)

## 2019-05-05 LAB — TSH: TSH: 0.28 u[IU]/mL — ABNORMAL LOW (ref 0.35–4.50)

## 2019-05-05 LAB — HEMOGLOBIN A1C: Hgb A1c MFr Bld: 6 % (ref 4.6–6.5)

## 2019-05-05 NOTE — Progress Notes (Signed)
Chief Complaint  Patient presents with  . Annual Exam    Doing well    HPI: Patient  Janet Nichols  49 y.o. comes in today for [place] visit  Just had colonoscopy  And path for multipl polyps pending  fam hx dm  Pfizer vaccine  done Health Maintenance  Topic Date Due  . INFLUENZA VACCINE  08/06/2019  . PAP SMEAR-Modifier  09/10/2019  . MAMMOGRAM  10/06/2019  . TETANUS/TDAP  09/14/2027  . HIV Screening  Completed   Health Maintenance Review LIFESTYLE:  Exercise:  Walking some    Tobacco/ETS: Alcohol:  Rare  Sugar beverages:ocass  Sleep: 7-8  Drug use: no HH of 4 Work: every 2 days  Diet eats red meat and rice reg     ROS:  GEN/ HEENT: No fever, significant weight changes sweats headaches vision problems hearing changes, CV/ PULM; No chest pain shortness of breath cough, syncope,edema  change in exercise tolerance. GI /GU: No adominal pain, vomiting, change in bowel habits. No blood in the stool. No significant GU symptoms. SKIN/HEME: ,no acute skin rashes suspicious lesions or bleeding. No lymphadenopathy, nodules, masses.  NEURO/ PSYCH:  No neurologic signs such as weakness numbness. No depression anxiety. IMM/ Allergy: No unusual infections.  Allergy .   REST of 12 system review negative except as per HPI   Past Medical History:  Diagnosis Date  . Acne   . Gestational diabetes   . Hepatitis B   . IC (interstitial cystitis)   . Low HDL (under 40)   . Rosacea     Past Surgical History:  Procedure Laterality Date  . bladder stretched    . COLONOSCOPY  05/03/2019  . ESSURE TUBAL LIGATION  2011    Family History  Problem Relation Age of Onset  . Hypertension Mother   . Diabetes Mother   . Kidney failure Mother        mom on dialysis has dn  . Stroke Mother   . Hypertension Sister   . Colon polyps Sister        72  . Breast cancer Cousin   . Esophageal cancer Cousin   . Colon polyps Sister        30  . Colon cancer Neg Hx   .  Stomach cancer Neg Hx   . Rectal cancer Neg Hx     Social History   Socioeconomic History  . Marital status: Married    Spouse name: Not on file  . Number of children: Not on file  . Years of education: Not on file  . Highest education level: Not on file  Occupational History  . Not on file  Tobacco Use  . Smoking status: Never Smoker  . Smokeless tobacco: Never Used  Substance and Sexual Activity  . Alcohol use: No    Alcohol/week: 0.0 standard drinks  . Drug use: No  . Sexual activity: Not on file  Other Topics Concern  . Not on file  Social History Narrative   Married   HH of 4       No pets   Sleep 7 hours   40 hours at least 10 hours days mon th friday   Exercise sometimes      She works as a Veterinary surgeon Strain:   . Difficulty of Paying Living Expenses:   Food Insecurity:   . Worried  About Running Out of Food in the Last Year:   . [place] in the Last Year:   Transportation Needs:   . Lack of Transportation (Medical):   Marland Kitchen Lack of Transportation (Non-Medical):   Physical Activity:   . Days of Exercise per Week:   . Minutes of Exercise per Session:   Stress:   . Feeling of Stress :   Social Connections:   . Frequency of Communication with Friends and Family:   . Frequency of Social Gatherings with Friends and Family:   . Attends Religious Services:   . Active Member of Clubs or Organizations:   . Attends Archivist Meetings:   Marland Kitchen Marital Status:     Outpatient Medications Prior to Visit  Medication Sig Dispense Refill  . clindamycin-benzoyl peroxide (BENZACLIN) gel Apply topically as needed. For break outs    . augmented betamethasone dipropionate (DIPROLENE-AF) 0.05 % cream APPLY CREAM TOPICALLY TO AFFECTED AREA UP TO TWICE DAILY AS NEEDED. DO NOT USE ON FACE GROIN UNDERARMS.  3   No facility-administered medications prior to visit.     EXAM:  BP 118/78   Pulse  61   Temp 97.9 F (36.6 C) (Temporal)   Ht 5' 1.5" (1.562 m)   Wt 130 lb 3.2 oz (59.1 kg)   LMP  (LMP Unknown) Comment: haven't had a period in about a year  SpO2 98%   BMI 24.20 kg/m   Body mass index is 24.2 kg/m. Wt Readings from Last 3 Encounters:  05/05/19 130 lb 3.2 oz (59.1 kg)  05/03/19 126 lb 9.6 oz (57.4 kg)  04/20/19 126 lb 9.6 oz (57.4 kg)    Physical Exam: Vital signs reviewed [id number] is a well-developed well-nourished alert cooperative    who appearsr stated age in no acute distress.  HEENT: normocephalic atraumatic , Eyes: PERRL EOM's full, conjunctiva clear, ., Ears: no deformity EAC's clear TMs with normal landmarks. Mouth: clear OP, masked NECK: supple without masses, thyromegaly or bruits. CHEST/PULM:  Clear to auscultation and percussion breath sounds equal no wheeze , rales or rhonchi. No chest wall deformities or tenderness. Breast: normal by inspection . No dimpling, discharge, masses, tenderness or discharge . CV: PMI is nondisplaced, S1 S2 no gallops, murmurs, rubs. Peripheral pulses are full without delay.No JVD .  ABDOMEN: Bowel sounds normal nontender  No guard or rebound, no hepato splenomegal no CVA tenderness.  No hernia. Extremtities:  No clubbing cyanosis or edema, no acute joint swelling or redness no focal atrophy NEURO:  Oriented x3, cranial nerves 3-12 appear to be intact, no obvious focal weakness,gait within normal limits no abnormal reflexes or asymmetrical SKIN: No acute rashes normal turgor, color, no bruising or petechiae. PSYCH: Oriented, good eye contact, no obvious depression anxiety, cognition and judgment appear normal. LN: no cervical axillary inguinal adenopathy  Lab Results  Component Value Date   WBC 7.4 08/03/2017   HGB 14.2 08/03/2017   HCT 41.9 08/03/2017   PLT 223.0 08/03/2017   GLUCOSE 114 (H) 12/10/2017   CHOL 149 09/13/2017   TRIG 138.0 09/13/2017   HDL 40.50 09/13/2017   LDLCALC 81 09/13/2017   ALT 13 12/27/2017     AST 18 12/27/2017   NA 141 08/03/2017   K 3.4 (L) 08/03/2017   CL 104 08/03/2017   CREATININE 0.70 08/03/2017   BUN 14 08/03/2017   CO2 29 08/03/2017   TSH 0.52 08/26/2017   HGBA1C 6.2 08/03/2017  last hep [name] was  10  2019  1. Mildly increased slightly coarsened echogenicity of the hepatic parenchyma as could be seen in the setting of hepatic steatosis and or provided history of chronic hepatitis-B. No discrete hepatic lesions. 2. Normal velocities and directional flow throughout the hepatic vasculature. BP Readings from Last 3 Encounters:  05/05/19 118/78  05/03/19 (!) 147/96  12/10/17 132/84    Lab plan  reviewed with patient   ASSESSMENT AND PLAN:  Discussed the following assessment and plan:    ICD-10-CM   1. Visit for preventive health examination  123456 Basic metabolic panel    CBC with Differential/Platelet    Hemoglobin A1c    Hepatic function panel    Lipid panel    TSH    Hepatitis B DNA, Ultraquantitative, PCR  2. Hyperglycemia  123456 Basic metabolic panel    CBC with Differential/Platelet    Hemoglobin A1c    Hepatic function panel    Lipid panel    TSH    Hepatitis B DNA, Ultraquantitative, PCR  3. Family history of diabetes mellitus  99991111 Basic metabolic panel    CBC with Differential/Platelet    Hemoglobin A1c    Hepatic function panel    Lipid panel    TSH    Hepatitis B DNA, Ultraquantitative, PCR  4. Hepatitis B carrier ([place])  B18.1 Hepatitis B DNA, Ultraquantitative, PCR    Hepatitis B E Antibody   Return for depending on results or 1 year , medication. Monitoring labs   Today   Pap utd  Had neg and neg  hpv last  Will revewied need for hep b fu monitoring Seems to be delays  From covid 19 interruption of care  May be due for  Another [name]  But this was not addressed at the visit today  Patient Care Team: Josefa Syracuse, Standley Brooking, MD as PCP - Hubbard Robinson Sharyon Cable, MD as Referring Physician (Urology) Garald Balding, MD (Orthopedic  Surgery) Brien Few, MD as Attending Physician (Obstetrics and Gynecology) dermatologist  Thornton Park, MD as Consulting Physician (Gastroenterology) Patient Instructions  Your exam is good  Checking your hep b status   Liver tests.  Will revewiew   Limit rice and simple carbohydrates to prevent diabetes .    Health Maintenance, Female Adopting a healthy lifestyle and getting preventive care are important in promoting health and wellness. Ask your health care provider about:  The right schedule for you to have regular tests and exams.  Things you can do on your own to prevent diseases and keep yourself healthy. What should I know about diet, weight, and exercise? Eat a healthy diet   Eat a diet that includes plenty of vegetables, fruits, low-fat dairy products, and lean protein.  Do not eat a lot of foods that are high in solid fats, added sugars, or sodium. Maintain a healthy weight Body mass index (BMI) is used to identify weight problems. It estimates body fat based on height and weight. Your health care provider can help determine your BMI and help you achieve or maintain a healthy weight. Get regular exercise Get regular exercise. This is one of the most important things you can do for your health. Most adults should:  Exercise for at least 150 minutes each week. The exercise should increase your heart rate and make you sweat (moderate-intensity exercise).  Do strengthening exercises at least twice a week. This is in addition to the moderate-intensity exercise.  Spend less time sitting. Even light physical activity can be beneficial. Watch  cholesterol and blood lipids Have your blood tested for lipids and cholesterol at 50 years of age, then have this test every 5 years. Have your cholesterol levels checked more often if:  Your lipid or cholesterol levels are high.  You are older than 50 years of age.  You are at high risk for heart disease. What should I  know about cancer screening? Depending on your health history and family history, you may need to have cancer screening at various ages. This may include screening for:  Breast cancer.  Cervical cancer.  Colorectal cancer.  Skin cancer.  Lung cancer. What should I know about heart disease, diabetes, and high blood pressure? Blood pressure and heart disease  High blood pressure causes heart disease and increases the risk of stroke. This is more likely to develop in people who have high blood pressure readings, are of African descent, or are overweight.  Have your blood pressure checked: ? Every 3-5 years if you are 71-95 years of age. ? Every year if you are 62 years old or older. Diabetes Have regular diabetes screenings. This checks your fasting blood sugar level. Have the screening done:  Once every three years after age 31 if you are at a normal weight and have a low risk for diabetes.  More often and at a younger age if you are overweight or have a high risk for diabetes. What should I know about preventing infection? Hepatitis B If you have a higher risk for hepatitis B, you should be screened for this virus. Talk with your health care provider to find out if you are at risk for hepatitis B infection. Hepatitis C Testing is recommended for:  Everyone born from [birth year] through 1966.  Anyone with known risk factors for hepatitis C. Sexually transmitted infections (STIs)  Get screened for STIs, including gonorrhea and chlamydia, if: ? You are sexually active and are younger than 50 years of age. ? You are older than 50 years of age and your health care provider tells you that you are at risk for this type of infection. ? Your sexual activity has changed since you were last screened, and you are at increased risk for chlamydia or gonorrhea. Ask your health care provider if you are at risk.  Ask your health care provider about whether you are at high risk for HIV. Your health  care provider may recommend a prescription medicine to help prevent HIV infection. If you choose to take medicine to prevent HIV, you should first get tested for HIV. You should then be tested every 3 months for as long as you are taking the medicine. Pregnancy  If you are about to stop having your period (premenopausal) and you may become pregnant, seek counseling before you get pregnant.  Take 400 to 800 micrograms (mcg) of folic acid every day if you become pregnant.  Ask for birth control (contraception) if you want to prevent pregnancy. Osteoporosis and menopause Osteoporosis is a disease in which the bones lose minerals and strength with aging. This can result in bone fractures. If you are 67 years old or older, or if you are at risk for osteoporosis and fractures, ask your health care provider if you should:  Be screened for bone loss.  Take a calcium or vitamin D supplement to lower your risk of fractures.  Be given hormone replacement therapy (HRT) to treat symptoms of menopause. Follow these instructions at home: Lifestyle  Do not use any products that contain nicotine or  tobacco, such as cigarettes, e-cigarettes, and chewing tobacco. If you need help quitting, ask your health care provider.  Do not use street drugs.  Do not share needles.  Ask your health care provider for help if you need support or information about quitting drugs. Alcohol use  Do not drink alcohol if: ? Your health care provider tells you not to drink. ? You are pregnant, may be pregnant, or are planning to become pregnant.  If you drink alcohol: ? Limit how much you use to 0-1 drink a day. ? Limit intake if you are breastfeeding.  Be aware of how much alcohol is in your drink. In the [place] one drink equals one 12 oz bottle of beer (355 mL), one 5 oz glass of wine (148 mL), or one 1 oz glass of hard liquor (44 mL). General instructions  Schedule regular health, dental, and eye exams.  Stay  current with your vaccines.  Tell your health care provider if: ? You often feel depressed. ? You have ever been abused or do not feel safe at home. Summary  Adopting a healthy lifestyle and getting preventive care are important in promoting health and wellness.  Follow your health care provider's instructions about healthy diet, exercising, and getting tested or screened for diseases.  Follow your health care provider's instructions on monitoring your cholesterol and blood pressure. This information is not intended to replace advice given to you by your health care provider. Make sure you discuss any questions you have with your health care provider. Document Revised: 12/15/2017 Document Reviewed: 12/15/2017 Elsevier Patient Education  2020 [employer] Clevester Helzer M.D.

## 2019-05-05 NOTE — Patient Instructions (Signed)
Your exam is good  Checking your hep b status   Liver tests.  Will revewiew   Limit rice and simple carbohydrates to prevent diabetes .    Health Maintenance, Female Adopting a healthy lifestyle and getting preventive care are important in promoting health and wellness. Ask your health care provider about:  The right schedule for you to have regular tests and exams.  Things you can do on your own to prevent diseases and keep yourself healthy. What should I know about diet, weight, and exercise? Eat a healthy diet   Eat a diet that includes plenty of vegetables, fruits, low-fat dairy products, and lean protein.  Do not eat a lot of foods that are high in solid fats, added sugars, or sodium. Maintain a healthy weight Body mass index (BMI) is used to identify weight problems. It estimates body fat based on height and weight. Your health care provider can help determine your BMI and help you achieve or maintain a healthy weight. Get regular exercise Get regular exercise. This is one of the most important things you can do for your health. Most adults should:  Exercise for at least 150 minutes each week. The exercise should increase your heart rate and make you sweat (moderate-intensity exercise).  Do strengthening exercises at least twice a week. This is in addition to the moderate-intensity exercise.  Spend less time sitting. Even light physical activity can be beneficial. Watch cholesterol and blood lipids Have your blood tested for lipids and cholesterol at 50 years of age, then have this test every 5 years. Have your cholesterol levels checked more often if:  Your lipid or cholesterol levels are high.  You are older than 50 years of age.  You are at high risk for heart disease. What should I know about cancer screening? Depending on your health history and family history, you may need to have cancer screening at various ages. This may include screening for:  Breast  cancer.  Cervical cancer.  Colorectal cancer.  Skin cancer.  Lung cancer. What should I know about heart disease, diabetes, and high blood pressure? Blood pressure and heart disease  High blood pressure causes heart disease and increases the risk of stroke. This is more likely to develop in people who have high blood pressure readings, are of African descent, or are overweight.  Have your blood pressure checked: ? Every 3-5 years if you are 48-25 years of age. ? Every year if you are 77 years old or older. Diabetes Have regular diabetes screenings. This checks your fasting blood sugar level. Have the screening done:  Once every three years after age 45 if you are at a normal weight and have a low risk for diabetes.  More often and at a younger age if you are overweight or have a high risk for diabetes. What should I know about preventing infection? Hepatitis B If you have a higher risk for hepatitis B, you should be screened for this virus. Talk with your health care provider to find out if you are at risk for hepatitis B infection. Hepatitis C Testing is recommended for:  Everyone born from [birth year] through 1966.  Anyone with known risk factors for hepatitis C. Sexually transmitted infections (STIs)  Get screened for STIs, including gonorrhea and chlamydia, if: ? You are sexually active and are younger than 50 years of age. ? You are older than 50 years of age and your health care provider tells you that you are at risk for  this type of infection. ? Your sexual activity has changed since you were last screened, and you are at increased risk for chlamydia or gonorrhea. Ask your health care provider if you are at risk.  Ask your health care provider about whether you are at high risk for HIV. Your health care provider may recommend a prescription medicine to help prevent HIV infection. If you choose to take medicine to prevent HIV, you should first get tested for HIV. You should  then be tested every 3 months for as long as you are taking the medicine. Pregnancy  If you are about to stop having your period (premenopausal) and you may become pregnant, seek counseling before you get pregnant.  Take 400 to 800 micrograms (mcg) of folic acid every day if you become pregnant.  Ask for birth control (contraception) if you want to prevent pregnancy. Osteoporosis and menopause Osteoporosis is a disease in which the bones lose minerals and strength with aging. This can result in bone fractures. If you are 5 years old or older, or if you are at risk for osteoporosis and fractures, ask your health care provider if you should:  Be screened for bone loss.  Take a calcium or vitamin D supplement to lower your risk of fractures.  Be given hormone replacement therapy (HRT) to treat symptoms of menopause. Follow these instructions at home: Lifestyle  Do not use any products that contain nicotine or tobacco, such as cigarettes, e-cigarettes, and chewing tobacco. If you need help quitting, ask your health care provider.  Do not use street drugs.  Do not share needles.  Ask your health care provider for help if you need support or information about quitting drugs. Alcohol use  Do not drink alcohol if: ? Your health care provider tells you not to drink. ? You are pregnant, may be pregnant, or are planning to become pregnant.  If you drink alcohol: ? Limit how much you use to 0-1 drink a day. ? Limit intake if you are breastfeeding.  Be aware of how much alcohol is in your drink. In the [place] one drink equals one 12 oz bottle of beer (355 mL), one 5 oz glass of wine (148 mL), or one 1 oz glass of hard liquor (44 mL). General instructions  Schedule regular health, dental, and eye exams.  Stay current with your vaccines.  Tell your health care provider if: ? You often feel depressed. ? You have ever been abused or do not feel safe at home. Summary  Adopting a  healthy lifestyle and getting preventive care are important in promoting health and wellness.  Follow your health care provider's instructions about healthy diet, exercising, and getting tested or screened for diseases.  Follow your health care provider's instructions on monitoring your cholesterol and blood pressure. This information is not intended to replace advice given to you by your health care provider. Make sure you discuss any questions you have with your health care provider. Document Revised: 12/15/2017 Document Reviewed: 12/15/2017 Elsevier Patient Education  2020 [employer].

## 2019-05-05 NOTE — Telephone Encounter (Signed)
  Follow up Call-  Call back number 05/03/2019  Post procedure Call Back phone  # [phone]  Permission to leave phone message Yes  Some recent data might be hidden     Patient questions:  Do you have a fever, pain , or abdominal swelling? No. Pain Score  0 *  Have you tolerated food without any problems? Yes.    Have you been able to return to your normal activities? Yes.    Do you have any questions about your discharge instructions: Diet   No. Medications  No. Follow up visit  No.  Do you have questions or concerns about your Care? No.  Actions: * If pain score is 4 or above: No action needed, pain <4.   1. Have you developed a fever since your procedure? No  2.   Have you had an respiratory symptoms (SOB or cough) since your procedure? No  3.   Have you tested positive for COVID 19 since your procedure No  4.   Have you had any family members/close contacts diagnosed with the COVID 19 since your procedure?  No   If yes to any of these questions please route to Joylene John, RN and Erenest Rasher, RN

## 2019-05-08 ENCOUNTER — Encounter: Payer: Self-pay | Admitting: Gastroenterology

## 2019-05-08 LAB — HEPATITIS B DNA, ULTRAQUANTITATIVE, PCR
Hepatitis B DNA (Calc): 3.03 Log IU/mL — ABNORMAL HIGH
Hepatitis B DNA: 1060 IU/mL — ABNORMAL HIGH

## 2019-05-09 ENCOUNTER — Telehealth: Payer: Self-pay | Admitting: Internal Medicine

## 2019-05-09 NOTE — Telephone Encounter (Signed)
The patient dropped off a Physician Results Form  Fax form to: [phone]  Disposition: Dr's Folder

## 2019-05-09 NOTE — Telephone Encounter (Signed)
Form placed in red folder. Last OV 05/05/2019. Please return once complete. Thank you.

## 2019-05-10 ENCOUNTER — Other Ambulatory Visit: Payer: Self-pay

## 2019-05-10 DIAGNOSIS — B181 Chronic viral hepatitis B without delta-agent: Secondary | ICD-10-CM

## 2019-05-10 DIAGNOSIS — R7989 Other specified abnormal findings of blood chemistry: Secondary | ICD-10-CM

## 2019-05-10 NOTE — Progress Notes (Signed)
Blood sugar is better Liver function normal  Thyroid off again Hep b copies still present but less that in past which is good  on record review it appears that you should have had a  routine liver ultrasound also follow up ( I think this got off track with the covid pandemic shut down )  So please order liver ultrasound  dx hep b carrier  Also tsh free t4 free t3 and TSI blood test can be done non fasting dx abnormal thyroid tests

## 2019-05-12 NOTE — Telephone Encounter (Signed)
I signed  the form  Please enter the labs and parameters    Also  Dr Tarri Glenn suggested  That the infectious disease clinic would be best to follow the hep b carrier state  over time . Let us know if that is acceptable to you and we can refer.  Marland Kitchen

## 2019-05-16 ENCOUNTER — Other Ambulatory Visit: Payer: Self-pay

## 2019-05-16 NOTE — Telephone Encounter (Signed)
Called patient and let her know that I have faxed her form for her.  I gave message to patient and she stated that she has her lab appointment tomorrow and her liver ultrasound on Thursday and wanted to see if you could go over results first before we send a referral to infections disease?

## 2019-05-16 NOTE — Telephone Encounter (Signed)
I will. Wait for the ultraound report

## 2019-05-17 ENCOUNTER — Other Ambulatory Visit (INDEPENDENT_AMBULATORY_CARE_PROVIDER_SITE_OTHER): Payer: 59

## 2019-05-17 DIAGNOSIS — R7989 Other specified abnormal findings of blood chemistry: Secondary | ICD-10-CM | POA: Diagnosis not present

## 2019-05-17 LAB — T3, FREE: T3, Free: 3.2 pg/mL (ref 2.3–4.2)

## 2019-05-17 LAB — TSH: TSH: 0.7 u[IU]/mL (ref 0.35–4.50)

## 2019-05-17 LAB — T4, FREE: Free T4: 0.86 ng/dL (ref 0.60–1.60)

## 2019-05-18 ENCOUNTER — Ambulatory Visit
Admission: RE | Admit: 2019-05-18 | Discharge: 2019-05-18 | Disposition: A | Discharge status: Home / Self Care | Payer: 59 | Source: Ambulatory Visit | Attending: Internal Medicine | Admitting: Internal Medicine

## 2019-05-18 DIAGNOSIS — B181 Chronic viral hepatitis B without delta-agent: Secondary | ICD-10-CM

## 2019-05-19 NOTE — Progress Notes (Signed)
Liver looks stable  So the low level hbv  and normal liver test so far  are favorable  but  should follow with every 6-12 months labs  and if abnormal would be best to see ID  clinic    So  your choice to be referred now  or  repeat  lfts and hep b dna levels and hep b e AG in 6 months and go from there

## 2019-05-19 NOTE — Progress Notes (Signed)
Thyroid now normal  one lab  pending  will follow

## 2019-05-22 LAB — THYROID STIMULATING IMMUNOGLOBULIN: TSI: <89 % baseline (ref <140)

## 2019-05-24 ENCOUNTER — Other Ambulatory Visit: Payer: Self-pay | Admitting: *Deleted

## 2019-05-24 NOTE — Patient Outreach (Signed)
[facility]) Care Management  05/24/2019  Sherrine Bowlen 01/03/1971 [id number]   [employer] Referral, first telephone outreach, unsuccessful. Left a message requesting a return call.    Eulah Pont. Myrtie Neither, MSN, [facility] Gerontological Nurse Practitioner [facility] Care Management [phone]

## 2019-05-30 ENCOUNTER — Other Ambulatory Visit: Payer: Self-pay | Admitting: *Deleted

## 2019-05-30 ENCOUNTER — Encounter: Payer: Self-pay | Admitting: *Deleted

## 2019-05-30 NOTE — Patient Outreach (Signed)
[facility]) Care Management  05/30/2019  Janet Nichols [birth year] [id number]   [employer] Referral. Second unsuccessful outreach. Unable to leave a message on pt cell or work #. Will send unsuccessful outreach letter.  Eulah Pont. Myrtie Neither, MSN, [facility] Gerontological Nurse Practitioner [facility] Care Management [phone]

## 2019-06-03 NOTE — Telephone Encounter (Signed)
Janet Nichols  I am not sure if the ball got dropped  On fu of her labs and [name]  Results   My advice on the [name] report was option to do labs in 62months since labs stable     In lieu of going to the ID clinic  But  I dont see  documneted FU plan  Or orders   May be we need a virtual visit or just the labs and fu as per result note . ? Please contact her about establishing follow up

## 2019-06-05 IMAGING — DX DG LUMBAR SPINE COMPLETE 4+V
5 series · 5 of 5 positions shown · non-contrast
Comparison: None.

CLINICAL DATA: Low back pain and bilateral leg pain with numbness
tingling for 2-3 weeks.

EXAM:
LUMBAR SPINE - COMPLETE 4+ VIEW

[l-spine ap]
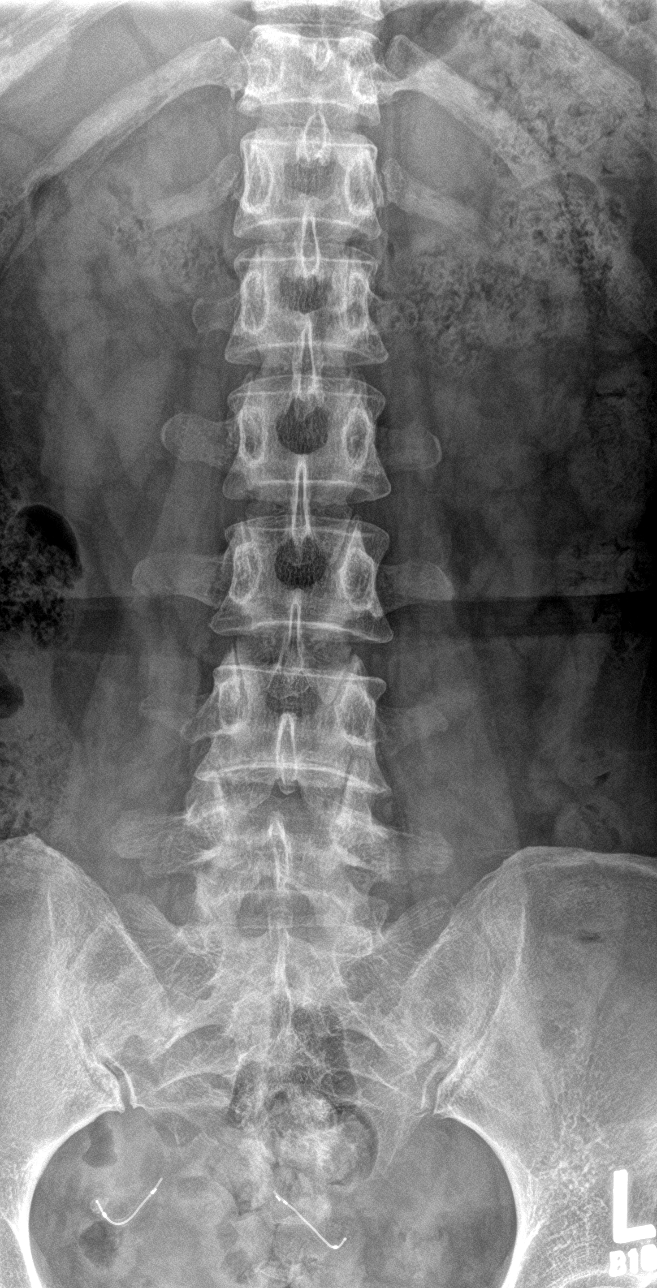

[l-spine obl (1 of 2)]
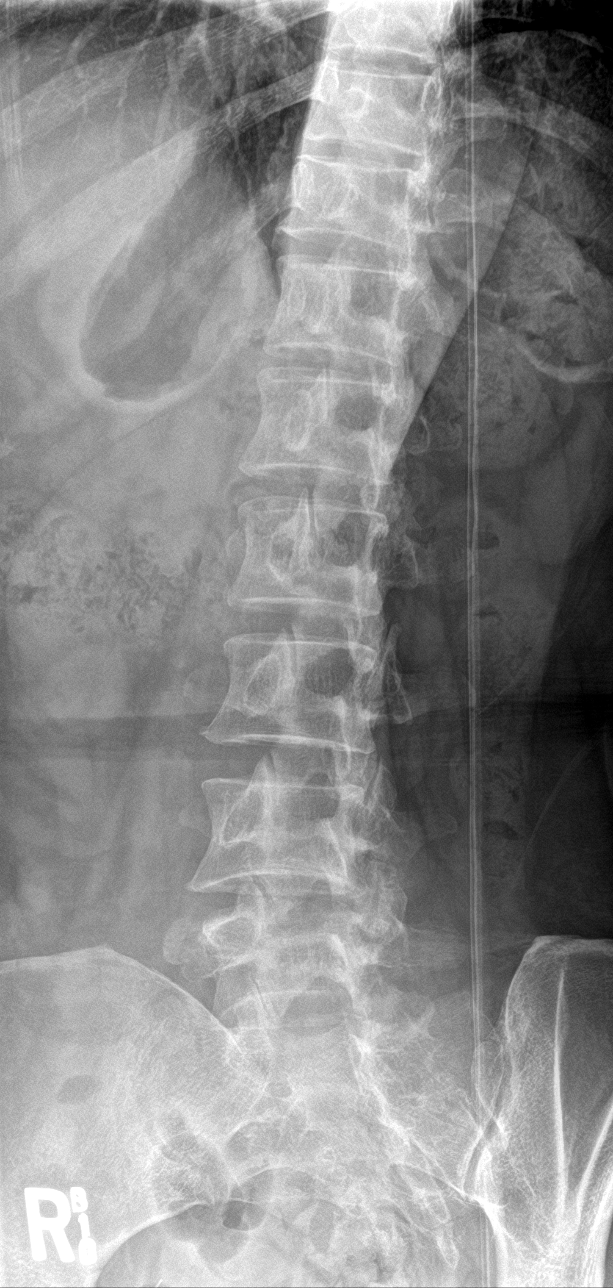

[l-spine obl (2 of 2)]
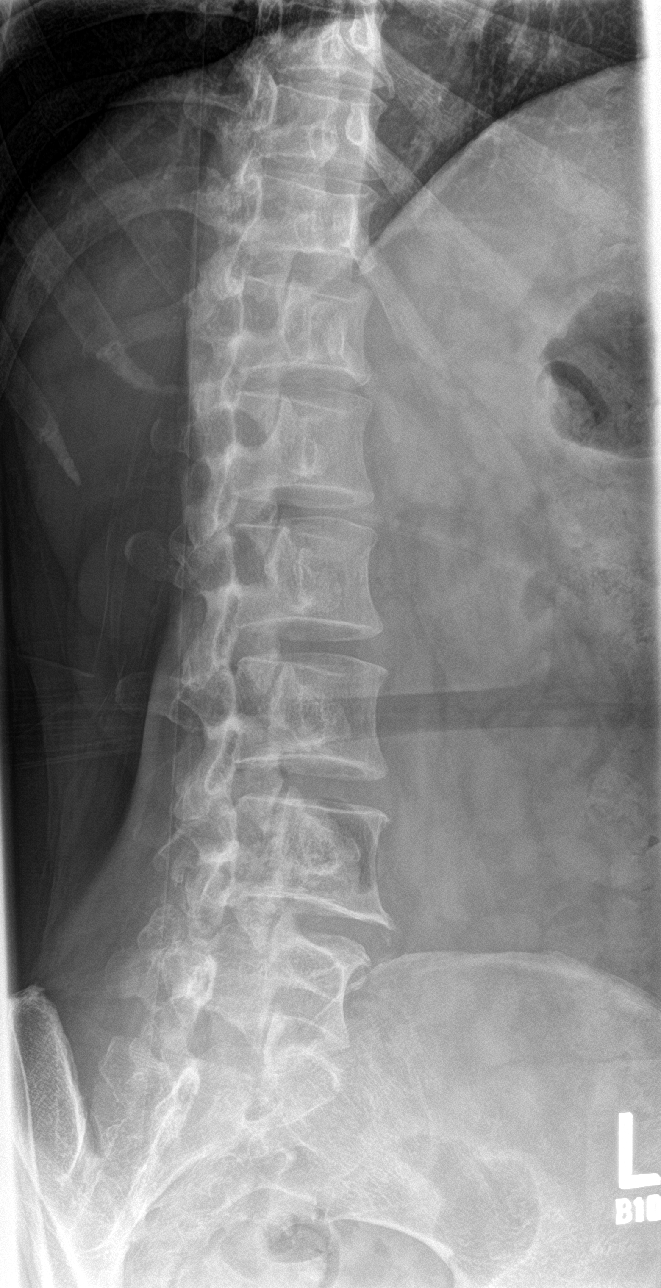

[l-spine lat]
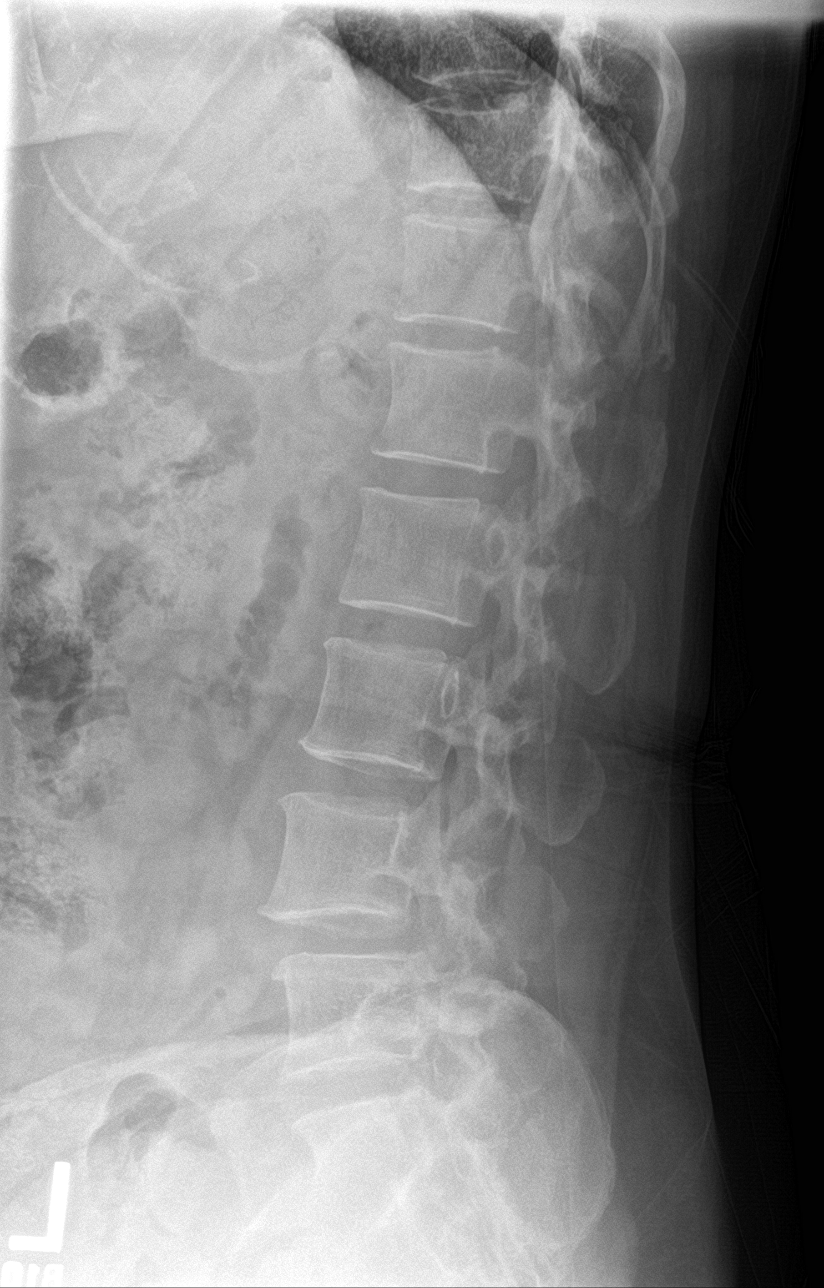

[l-spine spot]
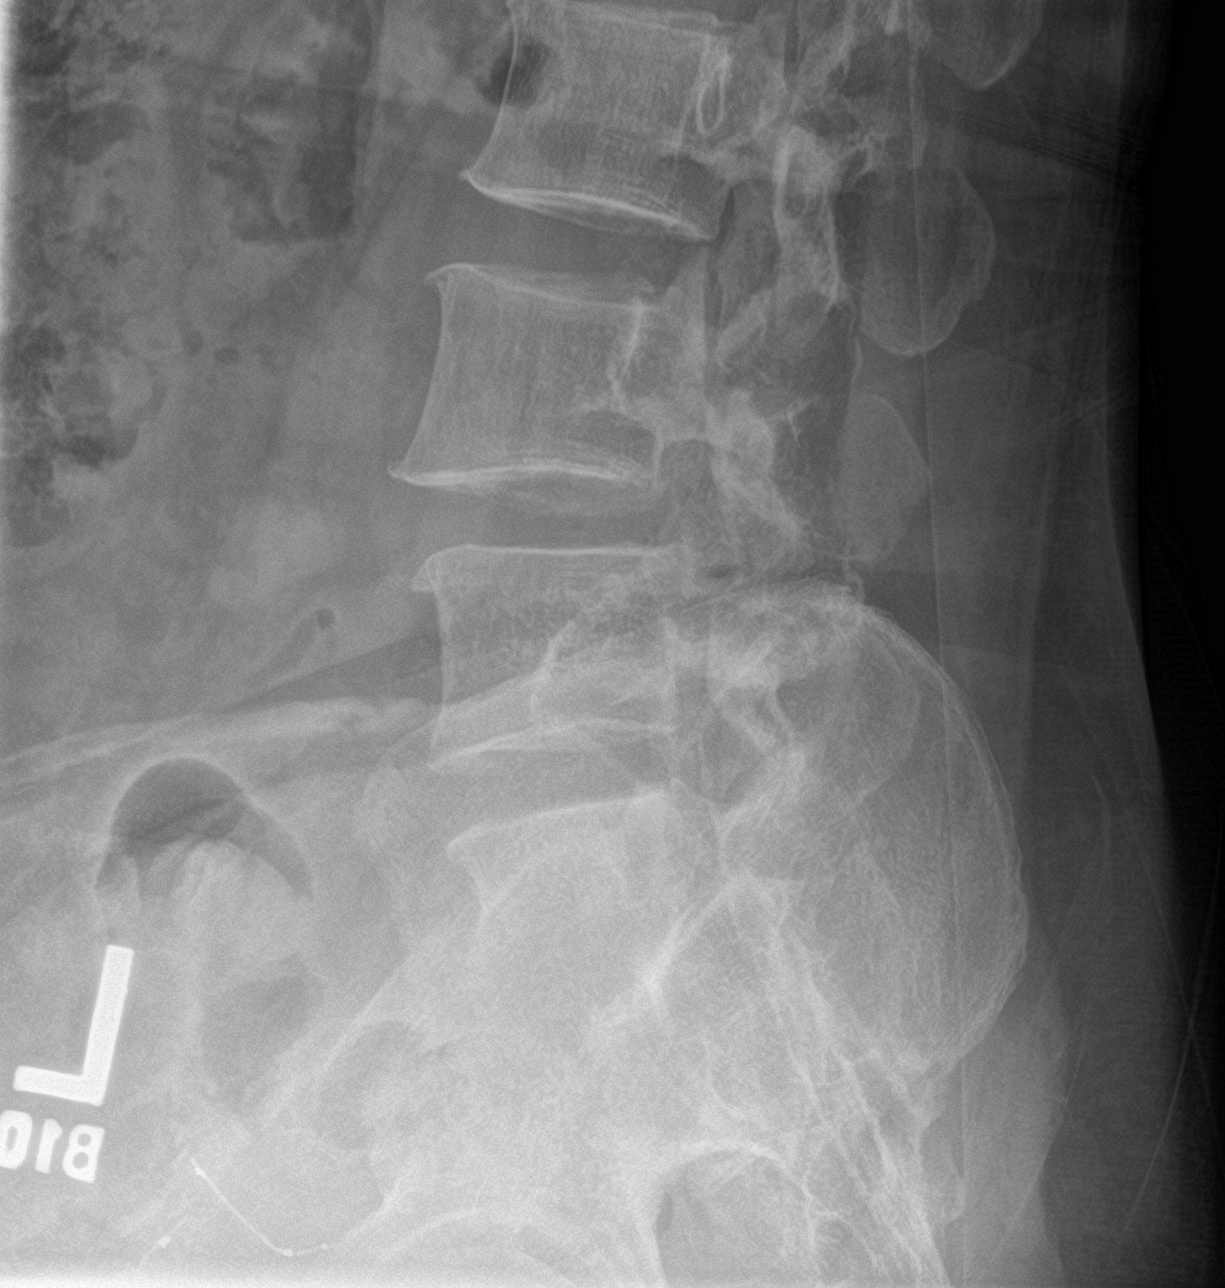

[5 of 5 positions shown; findings below may reference images not displayed]

FINDINGS: There is grade 1 spondylolisthesis at L4-5 with slight bilateral
facet arthritis at L4-5. No disc space narrowing. No fracture or
bone destruction.
IMPRESSION: Degenerative facet arthritis at L4-5 with grade 1 spondylolisthesis
at L4-5. Otherwise, normal lumbar spine.

## 2019-06-06 NOTE — Telephone Encounter (Signed)
Called patient and scheduled her a virtual appointment on 06/15/19 to discuss [name] and future labs. Looks like the Hepatitis B has not resulted that was ordered on 05/05/19. I will check with Jacquelynn Cree to see what happened.

## 2019-06-08 NOTE — Telephone Encounter (Signed)
I spoke with Janet Nichols and he stated that this test was missed unfortunately. Sending to let you know.

## 2019-06-15 ENCOUNTER — Telehealth (INDEPENDENT_AMBULATORY_CARE_PROVIDER_SITE_OTHER): Payer: 59 | Admitting: Internal Medicine

## 2019-06-15 ENCOUNTER — Other Ambulatory Visit: Payer: Self-pay

## 2019-06-15 ENCOUNTER — Encounter: Payer: Self-pay | Admitting: Internal Medicine

## 2019-06-15 VITALS — Ht 61.5 in | Wt 132.0 lb

## 2019-06-15 DIAGNOSIS — B181 Chronic viral hepatitis B without delta-agent: Secondary | ICD-10-CM | POA: Diagnosis not present

## 2019-06-15 NOTE — Progress Notes (Signed)
Virtual Visit via Video Note  I connected with@ on 06/15/19 at  9:00 AM EDT by a video enabled telemedicine application and verified that I am speaking with the correct person using two identifiers. Location patient:work  Location provider: home office Persons participating in the virtual visit: patient, provider  [place]th national recommendations  regarding COVID 19 pandemic   video visit is advised over in office visit for this patient.  Patient aware  of the limitations of evaluation and management by telemedicine and  availability of in person appointments. and agreed to proceed.   HPI: Janet Nichols presents for video visit  Is a hep b carrier and following  Liver US nl ex  Mild  Medical liver disease but nl vascular  No sx   Following   Dr Tarri Glenn had advise possibley having ID follow up her hep b state.   ROS: See pertinent positives and negatives per HPI.  Past Medical History:  Diagnosis Date  . Acne   . Gestational diabetes   . Hepatitis B   . IC (interstitial cystitis)   . Low HDL (under 40)   . Rosacea     Past Surgical History:  Procedure Laterality Date  . bladder stretched    . COLONOSCOPY  05/03/2019  . ESSURE TUBAL LIGATION  2011    Family History  Problem Relation Age of Onset  . Hypertension Mother   . Diabetes Mother   . Kidney failure Mother        mom on dialysis has dn  . Stroke Mother   . Hypertension Sister   . Colon polyps Sister        24  . Breast cancer Cousin   . Esophageal cancer Cousin   . Colon polyps Sister        74  . Colon cancer Neg Hx   . Stomach cancer Neg Hx   . Rectal cancer Neg Hx     Social History   Tobacco Use  . Smoking status: Never Smoker  . Smokeless tobacco: Never Used  Vaping Use  . Vaping Use: Never used  Substance Use Topics  . Alcohol use: No    Alcohol/week: 0.0 standard drinks  . Drug use: No      Current Outpatient Medications:  .  augmented betamethasone dipropionate (DIPROLENE-AF) 0.05 %  cream, APPLY CREAM TOPICALLY TO AFFECTED AREA UP TO TWICE DAILY AS NEEDED. DO NOT USE ON FACE GROIN UNDERARMS., Disp: , Rfl: 3 .  clindamycin-benzoyl peroxide (BENZACLIN) gel, Apply topically as needed. For break outs, Disp: , Rfl:  .  minocycline (MINOCIN) 100 MG capsule, Take 100 mg by mouth 2 (two) times daily., Disp: , Rfl:   EXAM: BP Readings from Last 3 Encounters:  05/05/19 118/78  05/03/19 (!) 147/96  12/10/17 132/84    VITALS per patient if applicable:  GENERAL: alert, oriented, appears well and in no acute distress no icteric   HEENT: atraumatic, conjunttiva clear, no obvious abnormalities on inspection of external nose and ears  NECK: normal movements of the head and neck  LUNGS: on inspection no signs of respiratory distress, breathing rate appears normal, no obvious gross SOB, gasping or wheezing  CV: no obvious cyanosis  MS: moves all visible extremities without noticeable abnormality  PSYCH/NEURO: pleasant and cooperative, no obvious depression or anxiety, speech and thought processing grossly intact  Lab on 05/17/2019  Component Date Value Ref Range Status  . TSI 05/17/2019 <89  <140 % baseline Final  . T3,  Free 05/17/2019 3.2  2.3 - 4.2 pg/mL Final  . Free T4 05/17/2019 0.86  0.60 - 1.60 ng/dL Final  . TSH 05/17/2019 0.70  0.35 - 4.50 uIU/mL Final  Office Visit on 05/05/2019  Component Date Value Ref Range Status  . Sodium 05/05/2019 143  135 - 145 mEq/L Final  . Potassium 05/05/2019 3.7  3.5 - 5.1 mEq/L Final  . Chloride 05/05/2019 105  96 - 112 mEq/L Final  . CO2 05/05/2019 30  19 - 32 mEq/L Final  . Glucose, Bld 05/05/2019 93  70 - 99 mg/dL Final  . BUN 05/05/2019 11  6 - 23 mg/dL Final  . Creatinine, Ser 05/05/2019 0.61  0.40 - 1.20 mg/dL Final  . GFR 05/05/2019 103.91  >60.00 mL/min Final  . Calcium 05/05/2019 9.1  8.4 - 10.5 mg/dL Final  . WBC 05/05/2019 6.1  4.0 - 10.5 K/uL Final  . RBC 05/05/2019 4.32  3.87 - 5.11 Mil/uL Final  . Hemoglobin  05/05/2019 13.5  12.0 - 15.0 g/dL Final  . HCT 05/05/2019 39.6  36 - 46 % Final  . MCV 05/05/2019 91.8  78.0 - 100.0 fl Final  . MCHC 05/05/2019 34.0  30.0 - 36.0 g/dL Final  . RDW 05/05/2019 13.0  11.5 - 15.5 % Final  . Platelets 05/05/2019 224.0  150 - 400 K/uL Final  . Neutrophils Relative % 05/05/2019 49.2  43 - 77 % Final  . Lymphocytes Relative 05/05/2019 40.8  12 - 46 % Final  . Monocytes Relative 05/05/2019 7.7  3 - 12 % Final  . Eosinophils Relative 05/05/2019 1.6  0 - 5 % Final  . Basophils Relative 05/05/2019 0.7  0 - 3 % Final  . Neutro Abs 05/05/2019 3.0  1.4 - 7.7 K/uL Final  . Lymphs Abs 05/05/2019 2.5  0.7 - 4.0 K/uL Final  . Monocytes Absolute 05/05/2019 0.5  0 - 1 K/uL Final  . Eosinophils Absolute 05/05/2019 0.1  0 - 0 K/uL Final  . Basophils Absolute 05/05/2019 0.0  0 - 0 K/uL Final  . Hgb A1c MFr Bld 05/05/2019 6.0  4.6 - 6.5 % Final  . Total Bilirubin 05/05/2019 0.6  0.2 - 1.2 mg/dL Final  . Bilirubin, Direct 05/05/2019 0.1  0.0 - 0.3 mg/dL Final  . Alkaline Phosphatase 05/05/2019 66  39 - 117 U/L Final  . AST 05/05/2019 18  0 - 37 U/L Final  . ALT 05/05/2019 11  0 - 35 U/L Final  . Total Protein 05/05/2019 7.3  6.0 - 8.3 g/dL Final  . Albumin 05/05/2019 4.3  3.5 - 5.2 g/dL Final  . Cholesterol 05/05/2019 168  0 - 200 mg/dL Final  . Triglycerides 05/05/2019 152.0* 0 - 149 mg/dL Final  . HDL 05/05/2019 41.70  >39.00 mg/dL Final  . VLDL 05/05/2019 30.4  0.0 - 40.0 mg/dL Final  . LDL Cholesterol 05/05/2019 96  0 - 99 mg/dL Final  . Total CHOL/HDL Ratio 05/05/2019 4   Final  . NonHDL 05/05/2019 126.33   Final  . TSH 05/05/2019 0.28* 0.35 - 4.50 uIU/mL Final  . Hepatitis B DNA 05/05/2019 1,060* IU/mL Final  . Hepatitis B DNA (Calc) 05/05/2019 3.03* Log IU/mL Final    ASSESSMENT AND PLAN:  Discussed the following assessment and plan:    ICD-10-CM   1. Hepatitis B carrier ([place])  B18.1 Hepatic function panel    Hepatitis B DNA, Ultraquantitative, PCR     Hepatitis B E Antigen   Alt has been consisently  normal  hbv dna is lower than last check  At this point we agree to repeat labs in 5 months and if  Any abnormality can refer to ID or if at any times she wishes referral will do   Counseled.   Expectant management and discussion of plan and treatment with opportunity to ask questions and all were answered. The patient agreed with the plan and demonstrated an understanding of the instructions.   Advised to call back or seek an in-person evaluation if worsening  or having  further concerns . Return for lab   in about 5 months  or as needed .    Shanon Ace, MD

## 2019-06-20 ENCOUNTER — Other Ambulatory Visit: Payer: Self-pay | Admitting: *Deleted

## 2019-06-20 NOTE — Patient Outreach (Signed)
[facility]) Care Management  06/20/2019  Mattie Nordell 01/13/1970 [id number]  Third telephone outreach, unsuccessful. Left a message that this would be my final call. Encouraged Mrs. Juncaj to keep my information for a future need.   Eulah Pont. Myrtie Neither, MSN, [facility] Gerontological Nurse Practitioner [facility] Care Management [phone]

## 2019-10-12 ENCOUNTER — Encounter: Payer: Self-pay | Admitting: Internal Medicine

## 2020-03-13 IMAGING — US US HEPATIC LIVER DOPPLER
1 series · 13 of 25 positions shown · non-contrast
Comparison: None.

CLINICAL DATA: History hepatitis B.

EXAM:
DUPLEX ULTRASOUND OF LIVER
TECHNIQUE: Color and duplex Doppler ultrasound was performed to evaluate the
hepatic in-flow and out-flow vessels.

[Series 1: us hepatic liver doppler · 0.22mm/px · 13 of 62 slices shown]
[im 1/62]
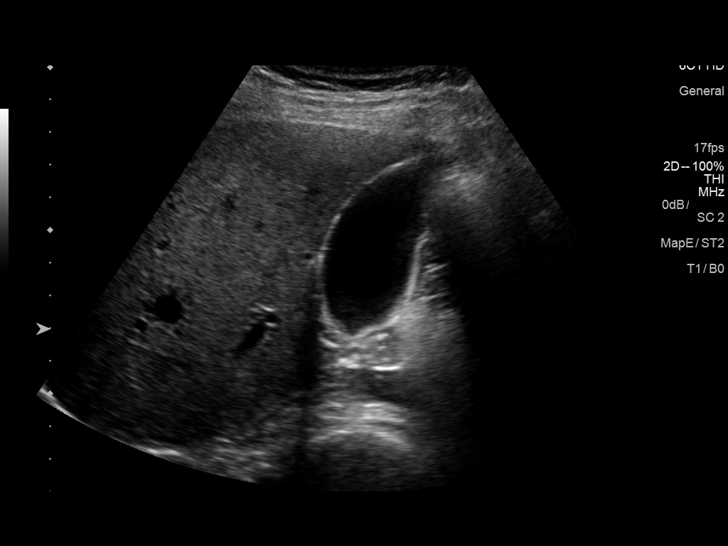
[im 6/62]
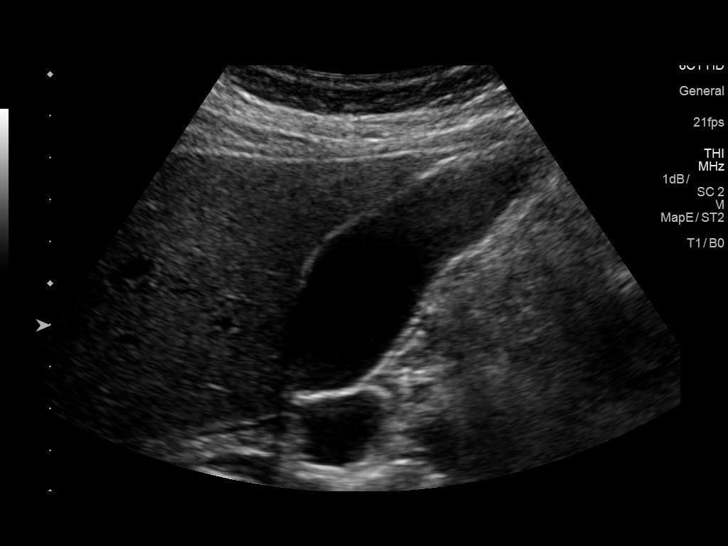
[im 11/62]
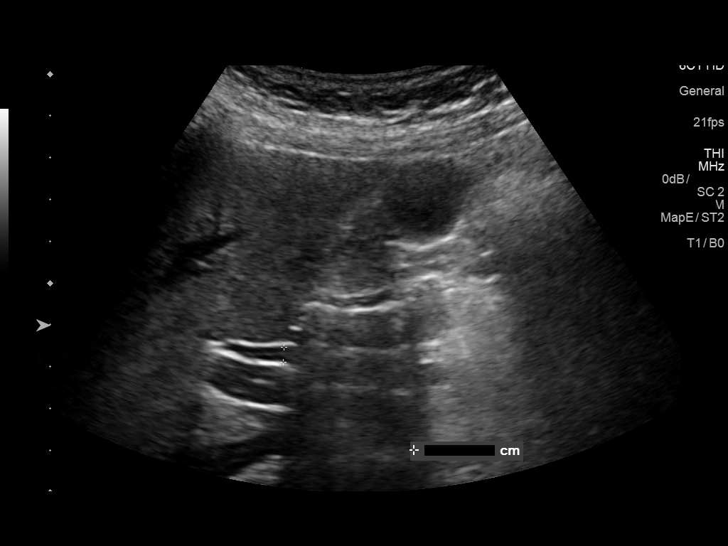
[im 16/62]
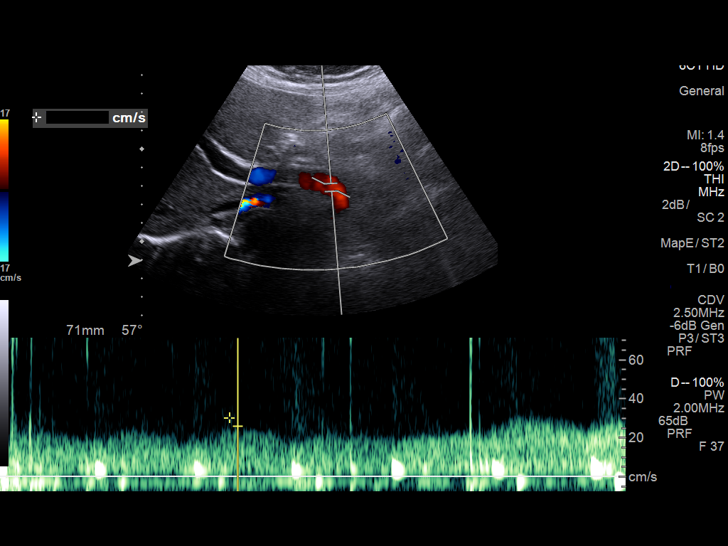
[im 21/62]
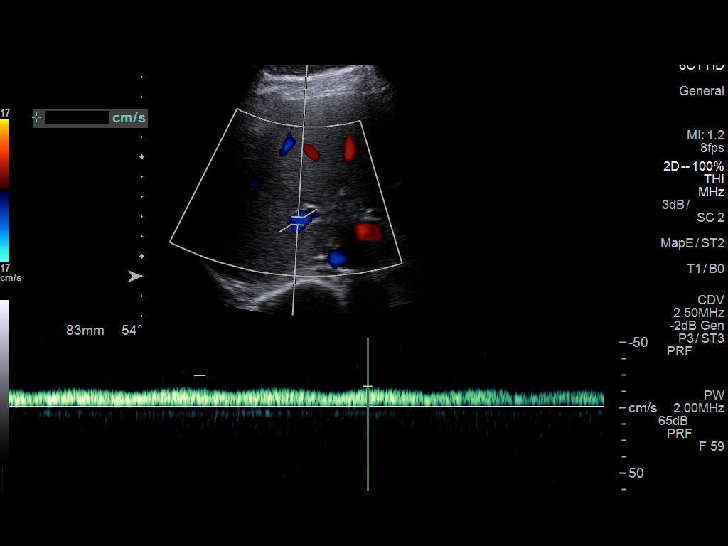
[im 26/62]
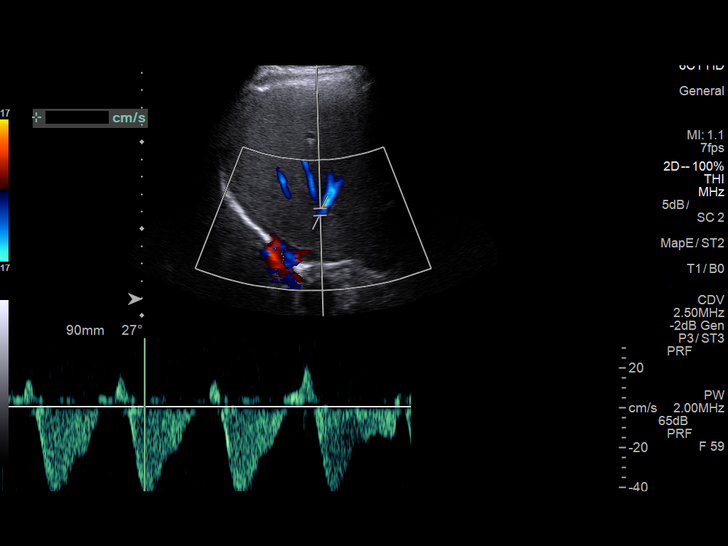
[im 31/62]
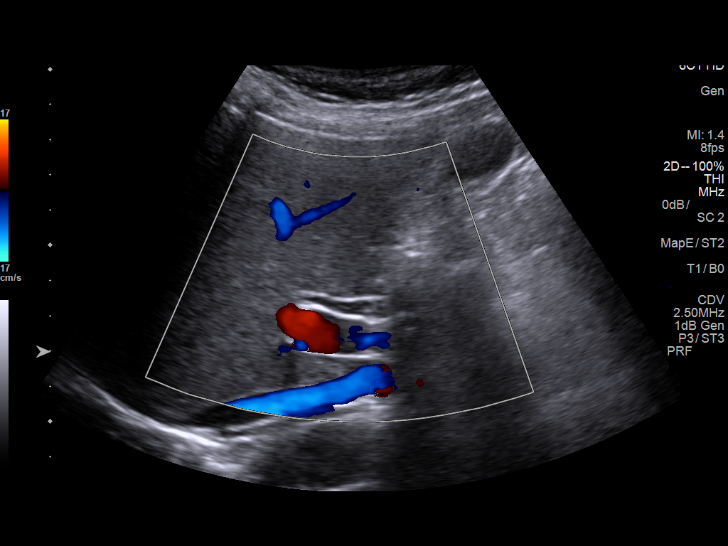
[im 36/62]
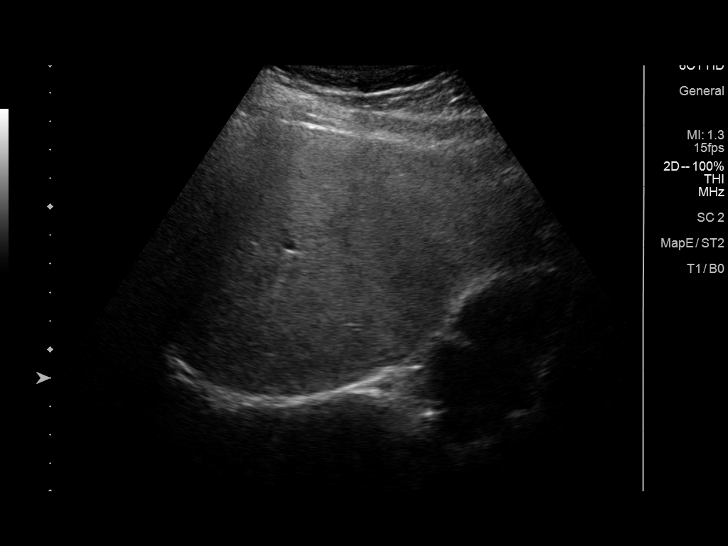
[im 41/62]
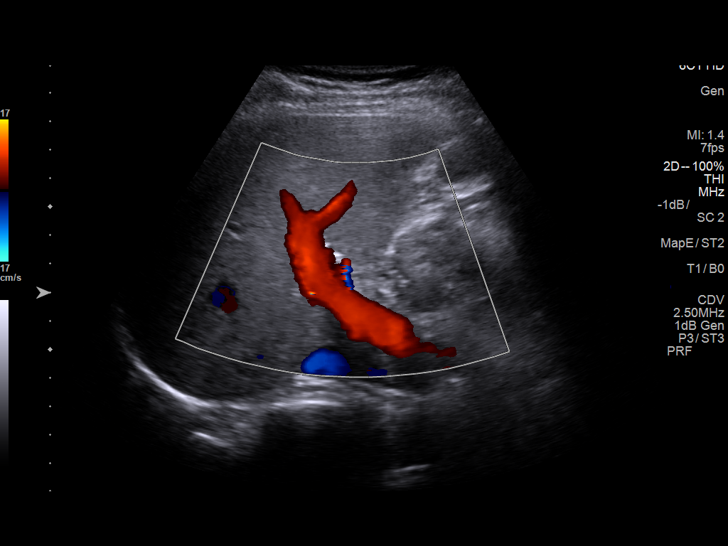
[im 46/62]
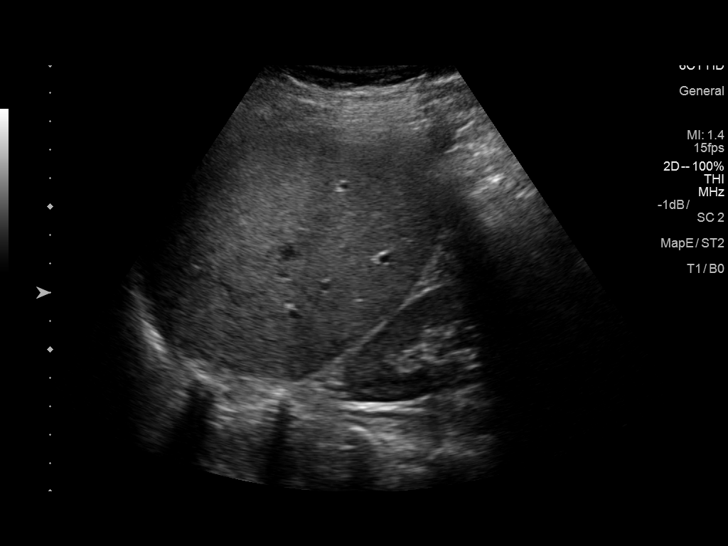
[im 51/62]
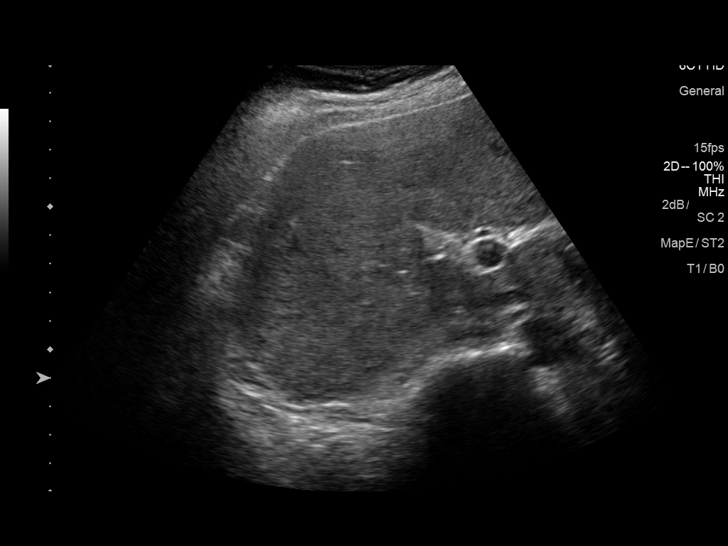
[im 56/62]
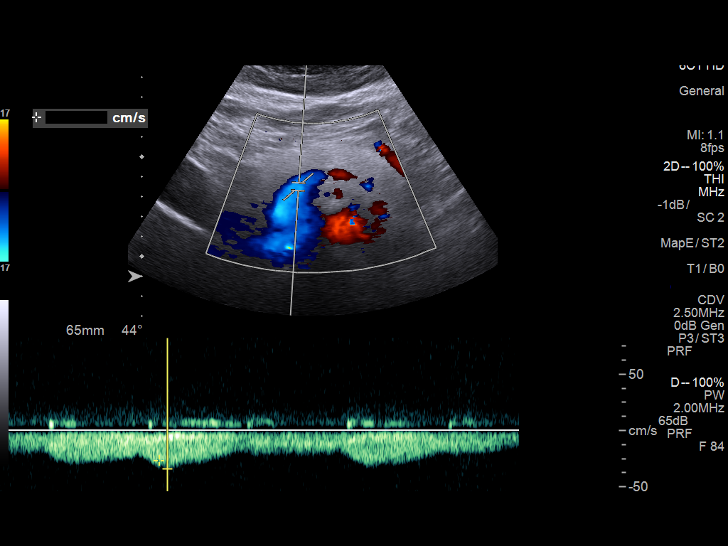
[im 62/62]
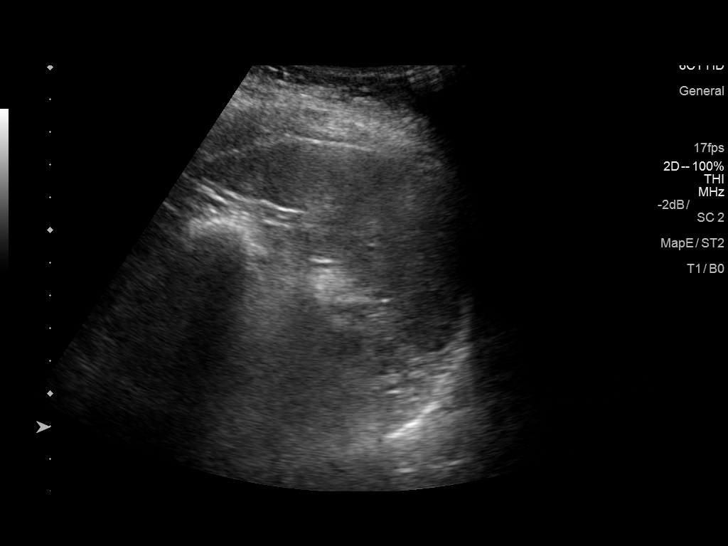

[13 of 25 positions shown; findings below may reference images not displayed]

FINDINGS: Liver: Mildly diffuse increased echogenicity of the hepatic
parenchyma (representative image 48). No discrete hepatic lesions.
No intrahepatic biliary duct dilatation. No ascites.

Common bile duct: Normal in caliber measuring 4 mm in diameter.

Gallbladder: Normal sonographic appearance of the gallbladder. No
gallbladder wall thickening or pericholecystic fluid. No echogenic
gallstones or biliary sludge. Negative sonographic Murphy's sign.

Spleen: Normal in size measuring 9.7 x 8.5 x 4.0 cm. No perisplenic
fluid.

_________________________________________________________

Portal Vein Velocities

Main:  19 cm/sec

Right:  20 cm/sec

Left:  18 cm/sec

Hepatic Vein Velocities

Right:  49 cm/sec

Middle:  43 cm/sec

Left:  19 cm/sec

IVC: Present and patent with normal respiratory phasicity.

Hepatic Artery Velocity:  63 cm/sec

Splenic Vein Velocity:  34 cm/sec

Varices: None visualized

Ascites: None visualized
IMPRESSION: 1. Mildly increased slightly coarsened echogenicity of the hepatic
parenchyma as could be seen in the setting of hepatic steatosis and
or provided history of chronic hepatitis-B. No discrete hepatic
lesions.
2. Normal velocities and directional flow throughout the hepatic
vasculature.

## 2020-04-29 ENCOUNTER — Encounter: Payer: Self-pay | Admitting: Gastroenterology

## 2020-10-15 LAB — HM MAMMOGRAPHY

## 2020-10-22 ENCOUNTER — Telehealth: Payer: 59 | Admitting: Family Medicine

## 2020-11-06 ENCOUNTER — Encounter: Payer: Self-pay | Admitting: Internal Medicine

## 2021-04-15 IMAGING — US US HEPATIC LIVER DOPPLER
1 series · 14 of 25 positions shown · non-contrast
Comparison: 10/15/2017

CLINICAL DATA: 48-year-old female with hepatitis-B history

EXAM:
DUPLEX ULTRASOUND OF LIVER
TECHNIQUE: Color and duplex Doppler ultrasound was performed to evaluate the
hepatic in-flow and out-flow vessels.

[Series 1: us hepatic liver doppler · 0.22mm/px · 14 of 73 slices shown]
[im 1/73]
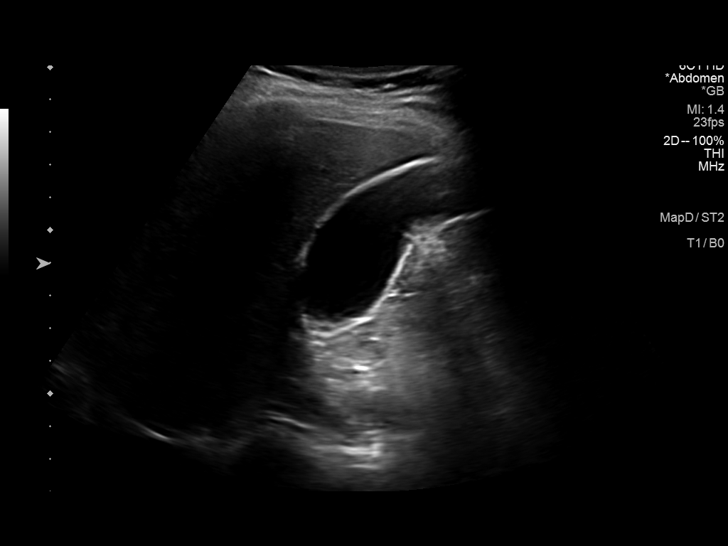
[im 7/73]
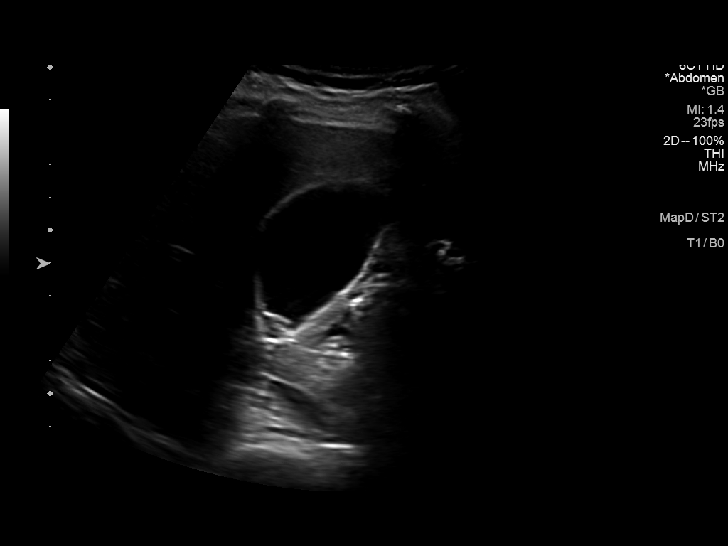
[im 13/73]
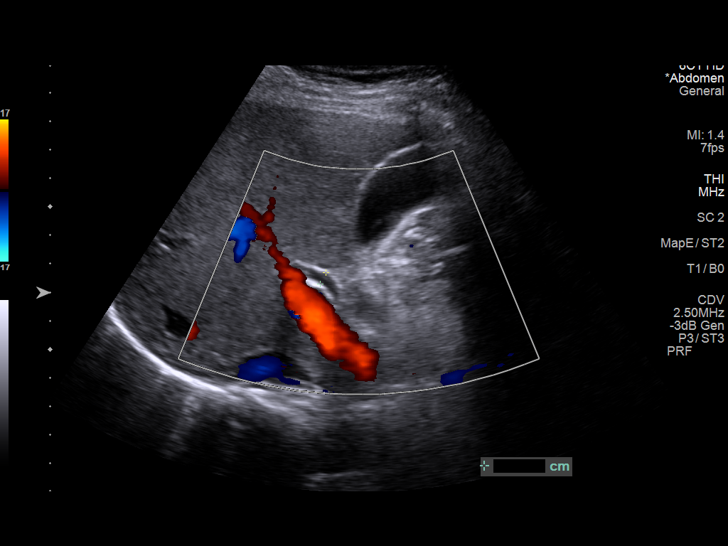
[im 19/73]
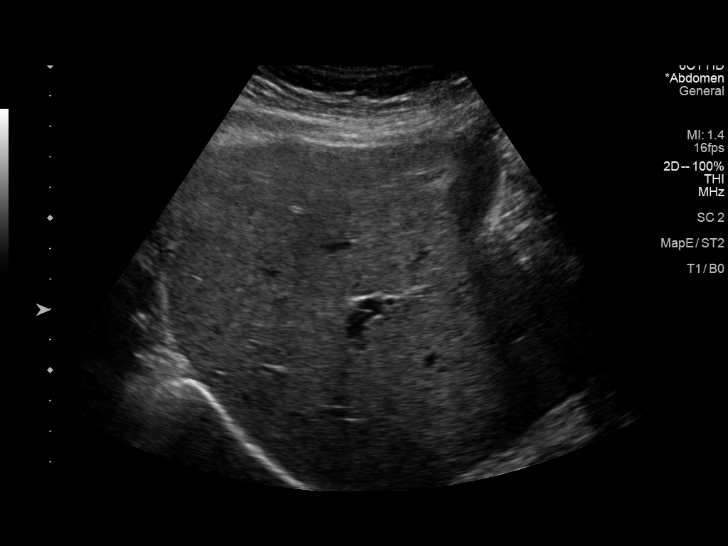
[im 25/73]
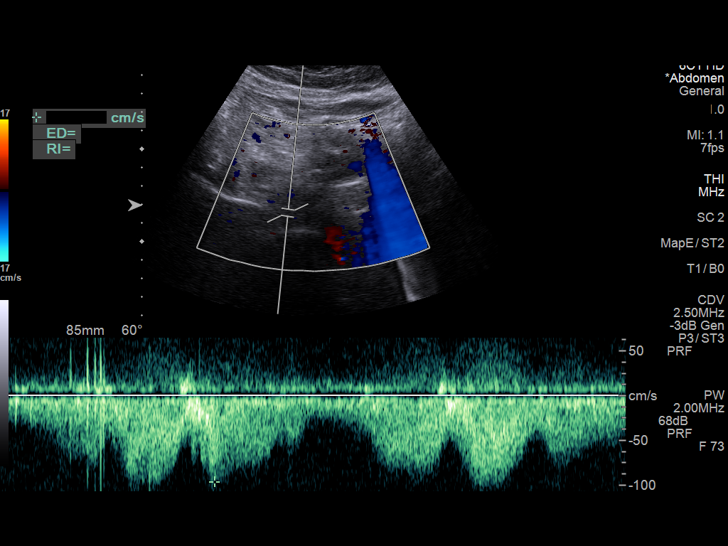
[im 28/73]
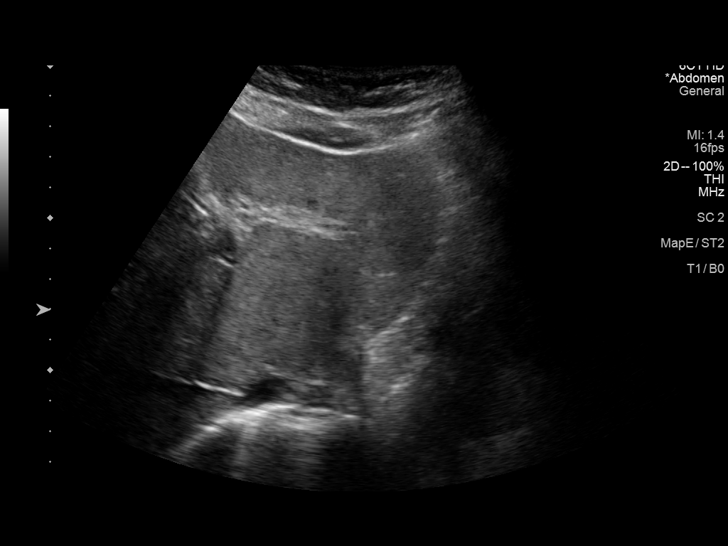
[im 34/73]
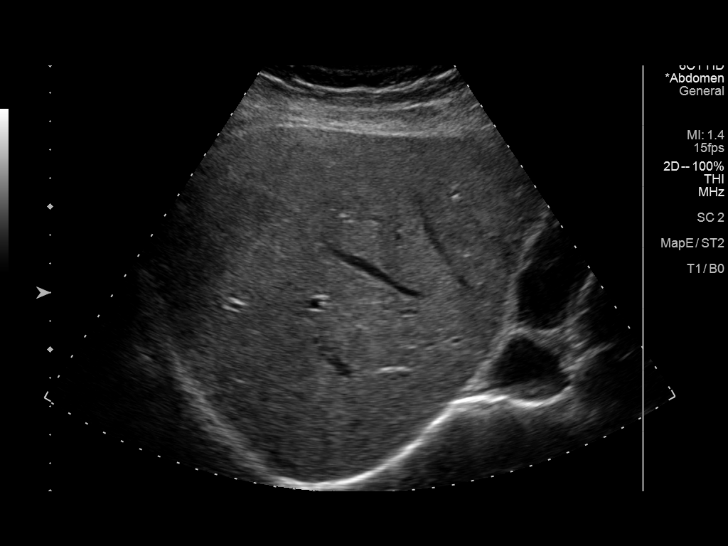
[im 40/73]
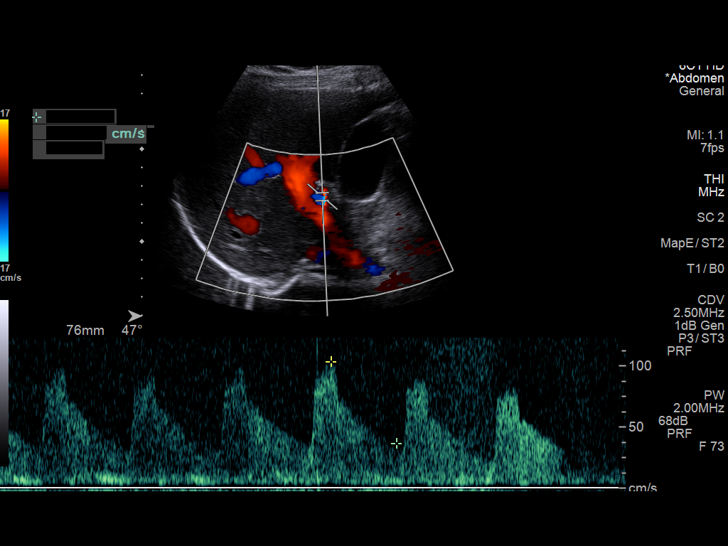
[im 46/73]
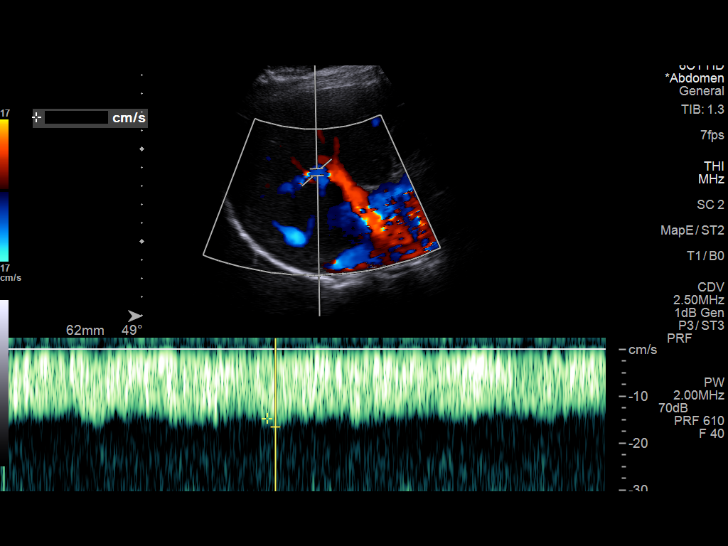
[im 49/73]
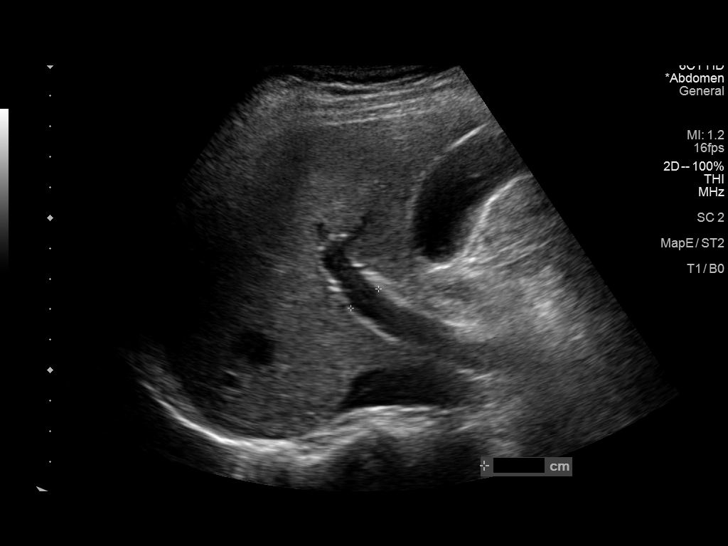
[im 55/73]
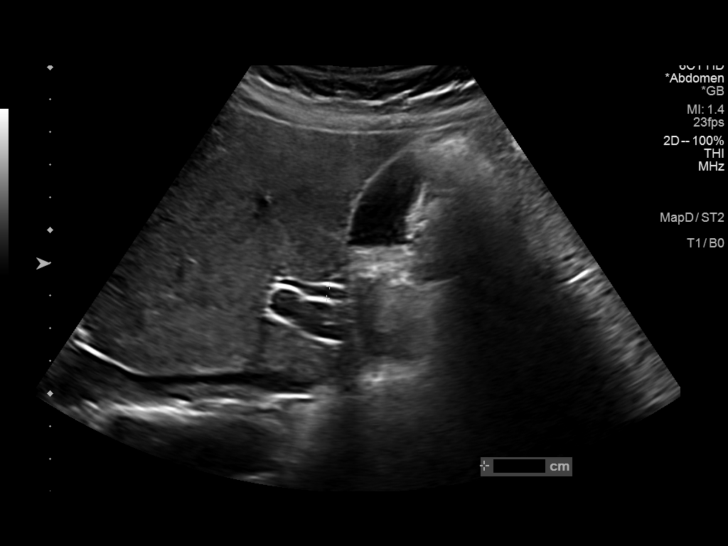
[im 61/73]
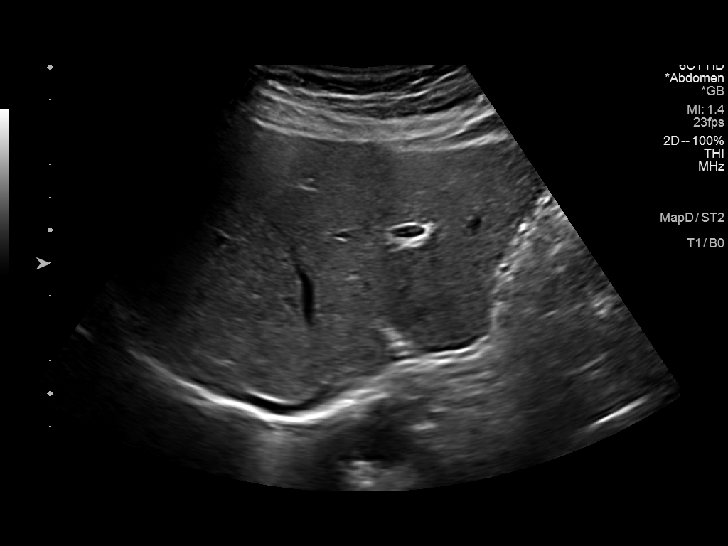
[im 67/73]
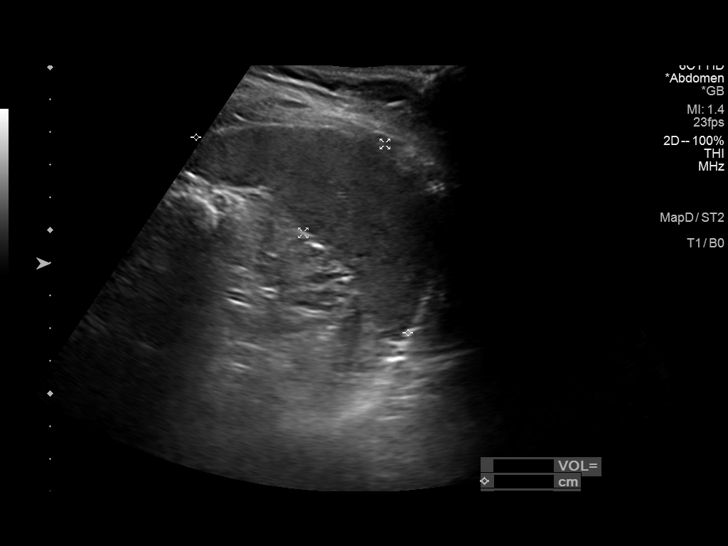
[im 73/73]
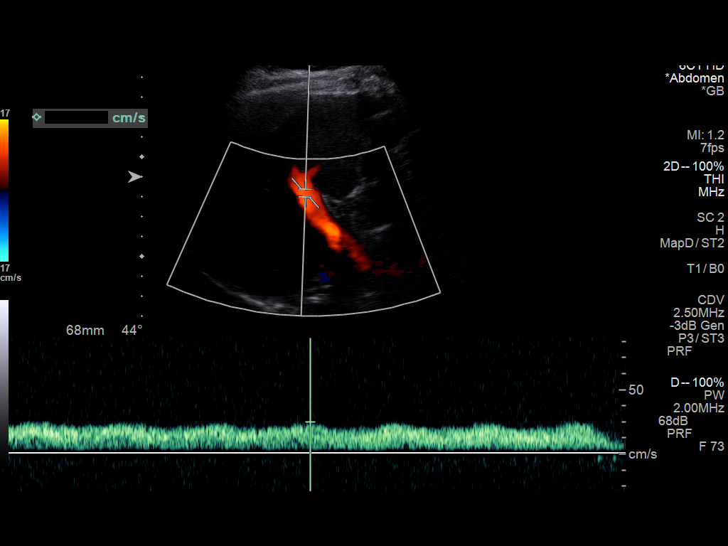

[14 of 25 positions shown; findings below may reference images not displayed]

FINDINGS: Portal Vein Velocities

Main:  30 cm/sec

Right:  17 cm/sec

Left:  11 cm/sec

Hepatic Vein Velocities

Right:  56 cm/sec

Middle:  645 cm/sec

Left:  58 cm/sec

Hepatic Artery Velocity:  104 cm/sec

Splenic Vein Velocity:  33 cm/sec

Varices: None

Ascites: None

Spleen estimated 102 cc

Coarsened echotexture of liver parenchyma without significant
nodularity of the liver contour. No focal lesion identified
IMPRESSION: Unremarkable duplex of the hepatic vasculature.

Mildly heterogeneous liver parenchyma suggest medical liver disease.

## 2021-10-01 ENCOUNTER — Encounter: Payer: Self-pay | Admitting: Gastroenterology

## 2021-10-21 ENCOUNTER — Ambulatory Visit (AMBULATORY_SURGERY_CENTER): Payer: Self-pay

## 2021-10-21 ENCOUNTER — Telehealth: Payer: Self-pay | Admitting: Gastroenterology

## 2021-10-21 VITALS — Ht 62.0 in | Wt 129.0 lb

## 2021-10-21 DIAGNOSIS — Z800 Family history of malignant neoplasm of digestive organs: Secondary | ICD-10-CM

## 2021-10-21 DIAGNOSIS — Z86010 Personal history of colonic polyps: Secondary | ICD-10-CM

## 2021-10-21 MED ORDER — SUTAB 1479-225-188 MG PO TABS: 1.0000 | ORAL_TABLET | Freq: Once | ORAL | 0 refills | Status: AC

## 2021-10-21 MED ORDER — NA SULFATE-K SULFATE-MG SULF 17.5-3.13-1.6 GM/177ML PO SOLN: 1.0000 | Freq: Once | ORAL | 0 refills | Status: AC

## 2021-10-21 NOTE — Telephone Encounter (Signed)
Inbound call from patient stating that she just had her pre visit a little while ago and she went to [facility] to pick up her prep and the wrong prep was sent in. Patient stated that the pharmacy had a prescription for Suprep and not Sutab. Patient is requesting prep to be sent in as soon as possible because she is currently still at [facility]. Please advise.

## 2021-10-21 NOTE — Telephone Encounter (Signed)
Sutab sent into pharmacy.  Pt made aware

## 2021-10-21 NOTE — Progress Notes (Signed)
No egg or soy allergy known to patient  No issues known to pt with past sedation with any surgeries or procedures Patient denies ever being told they had issues or difficulty with intubation  No FH of Malignant Hyperthermia Pt is not on diet pills Pt is not on  home 02  Pt is not on blood thinners  Pt denies issues with constipation  No A fib or A flutter Have any cardiac testing pending--no Pt instructed to use [email or web address] or GoodRx for a price reduction on prep   

## 2021-11-11 ENCOUNTER — Encounter: Payer: 59 | Admitting: Gastroenterology
